# Patient Record
Sex: Female | Born: 1990 | Race: Black or African American | Hispanic: No | Marital: Single | Smoking: Former smoker
Health system: Southern US, Community
[De-identification: ages and names within clinical notes are randomized; demographics above are authoritative.]

## PROBLEM LIST (undated history)

## (undated) DIAGNOSIS — M5030 Other cervical disc degeneration, unspecified cervical region: Secondary | ICD-10-CM

## (undated) DIAGNOSIS — L309 Dermatitis, unspecified: Secondary | ICD-10-CM

## (undated) DIAGNOSIS — K9041 Non-celiac gluten sensitivity: Secondary | ICD-10-CM

## (undated) DIAGNOSIS — B9689 Other specified bacterial agents as the cause of diseases classified elsewhere: Secondary | ICD-10-CM

## (undated) DIAGNOSIS — Q859 Phakomatosis, unspecified: Secondary | ICD-10-CM

## (undated) DIAGNOSIS — K219 Gastro-esophageal reflux disease without esophagitis: Secondary | ICD-10-CM

## (undated) DIAGNOSIS — F909 Attention-deficit hyperactivity disorder, unspecified type: Secondary | ICD-10-CM

## (undated) DIAGNOSIS — N760 Acute vaginitis: Secondary | ICD-10-CM

## (undated) DIAGNOSIS — T7840XA Allergy, unspecified, initial encounter: Secondary | ICD-10-CM

## (undated) DIAGNOSIS — R946 Abnormal results of thyroid function studies: Secondary | ICD-10-CM

## (undated) HISTORY — DX: Allergy, unspecified, initial encounter: T78.40XA

## (undated) HISTORY — DX: Other cervical disc degeneration, unspecified cervical region: M50.30

## (undated) HISTORY — DX: Dermatitis, unspecified: L30.9

## (undated) HISTORY — DX: Attention-deficit hyperactivity disorder, unspecified type: F90.9

## (undated) HISTORY — DX: Gastro-esophageal reflux disease without esophagitis: K21.9

## (undated) HISTORY — DX: Other specified bacterial agents as the cause of diseases classified elsewhere: N76.0

## (undated) HISTORY — DX: Other specified bacterial agents as the cause of diseases classified elsewhere: B96.89

## (undated) HISTORY — DX: Abnormal results of thyroid function studies: R94.6

## (undated) HISTORY — DX: Phakomatosis, unspecified: Q85.9

## (undated) HISTORY — PX: TOOTH EXTRACTION: SUR596

---

## 2014-04-22 ENCOUNTER — Emergency Department (INDEPENDENT_AMBULATORY_CARE_PROVIDER_SITE_OTHER)
Admission: EM | Admit: 2014-04-22 | Discharge: 2014-04-22 | Disposition: A | Discharge status: Home / Self Care | Payer: PRIVATE HEALTH INSURANCE | Source: Home / Self Care | Attending: Emergency Medicine | Admitting: Emergency Medicine

## 2014-04-22 ENCOUNTER — Emergency Department (INDEPENDENT_AMBULATORY_CARE_PROVIDER_SITE_OTHER): Payer: PRIVATE HEALTH INSURANCE

## 2014-04-22 ENCOUNTER — Encounter (HOSPITAL_COMMUNITY): Payer: Self-pay | Admitting: Emergency Medicine

## 2014-04-22 DIAGNOSIS — IMO0001 Reserved for inherently not codable concepts without codable children: Secondary | ICD-10-CM

## 2014-04-22 DIAGNOSIS — M791 Myalgia, unspecified site: Secondary | ICD-10-CM

## 2014-04-22 LAB — CBC WITH DIFFERENTIAL/PLATELET
BASOS ABS: 0 10*3/uL (ref 0.0–0.1)
BASOS PCT: 0 % (ref 0–1)
EOS ABS: 0 10*3/uL (ref 0.0–0.7)
Eosinophils Relative: 1 % (ref 0–5)
HCT: 39.4 % (ref 36.0–46.0)
Hemoglobin: 13.3 g/dL (ref 12.0–15.0)
Lymphocytes Relative: 20 % (ref 12–46)
Lymphs Abs: 1.2 10*3/uL (ref 0.7–4.0)
MCH: 32 pg (ref 26.0–34.0)
MCHC: 33.8 g/dL (ref 30.0–36.0)
MCV: 94.9 fL (ref 78.0–100.0)
Monocytes Absolute: 0.2 10*3/uL (ref 0.1–1.0)
Monocytes Relative: 4 % (ref 3–12)
NEUTROS ABS: 4.3 10*3/uL (ref 1.7–7.7)
NEUTROS PCT: 75 % (ref 43–77)
Platelets: 240 10*3/uL (ref 150–400)
RBC: 4.15 MIL/uL (ref 3.87–5.11)
RDW: 12.3 % (ref 11.5–15.5)
WBC: 5.7 10*3/uL (ref 4.0–10.5)

## 2014-04-22 NOTE — ED Notes (Signed)
Pt  Reports  Some  Swelling  Of the  Glands  Of   Her  Neck         For  About  3  Weeks  With  Vague  Symptoms  Of  Neck  Pain /  Headache      Body  Aches           And  Blurred  Vision       She  Is  Sitting upright on  The  Exam table  Speaking in  Complete  sentances    And  Is  In no  Acute  Distress

## 2014-04-22 NOTE — ED Provider Notes (Signed)
CSN: [id number]     Arrival date & time 04/22/14  1535 History   First MD Initiated Contact with Patient 04/22/14 1555     Chief Complaint  Patient presents with  . Neck Pain   (Consider location/radiation/quality/duration/timing/severity/associated sxs/prior Treatment) HPI She is here today for evaluation of neck pain. He states she has had a 2-3 week history of body aches, including neck and back. She has also noticed some lymph nodes in her right neck. She states she will sometimes feel a pressure sensation in the front of her throat. The neck pain is right-sided. She also noted a lymph node in her groin 2 weeks ago, but states this is resolved. She denies any fevers, sore throat, cough, shortness of breath. She does report night sweats over the last few weeks. She's attributed this to a gluten sensitivity. She does state she's had a little bit of ear pain intermittently. No weight loss.  She has appointments with her primary care, OB/GYN, and GI doctor coming up this month.  History reviewed. No pertinent past medical history. History reviewed. No pertinent past surgical history. History reviewed. No pertinent family history. History  Substance Use Topics  . Smoking status: Not on file  . Smokeless tobacco: Not on file  . Alcohol Use: Not on file   OB History   Grav Para Term Preterm Abortions TAB SAB Ect Mult Living                 Review of Systems  Constitutional: Negative for fever, chills, activity change, appetite change and unexpected weight change.  HENT: Positive for ear pain. Negative for congestion, rhinorrhea, sinus pressure, sore throat and trouble swallowing.   Respiratory: Negative for cough and shortness of breath.   Cardiovascular: Negative for chest pain.  Gastrointestinal: Negative.   Musculoskeletal: Positive for back pain, myalgias and neck pain.  Neurological: Positive for headaches. Negative for weakness.  Hematological: Positive for adenopathy.     Allergies  Review of patient's allergies indicates not on file.  Home Medications   Prior to Admission medications   Not on File   BP 122/81  Pulse 78  Temp(Src) 98.4 F (36.9 C) (Oral)  Resp 18  SpO2 96%  LMP 04/11/2014 Physical Exam  Constitutional: She is oriented to person, place, and time. She appears well-developed and well-nourished. No distress.  HENT:  Head: Normocephalic and atraumatic.  Right Ear: External ear normal.  Left Ear: Tympanic membrane and external ear normal.  Mouth/Throat: Mucous membranes are not dry. No oropharyngeal exudate, posterior oropharyngeal edema or posterior oropharyngeal erythema.  Eyes: Conjunctivae are normal. Right eye exhibits no discharge. Left eye exhibits no discharge.  Neck: Normal range of motion. Neck supple.  Right sided cervical LAD.  Fullness of bilateral anterior neck not clearly related to thyroid.  Cardiovascular: Normal rate, regular rhythm and normal heart sounds.   No murmur heard. Pulmonary/Chest: Effort normal and breath sounds normal. No respiratory distress. She has no wheezes. She has no rales.  Musculoskeletal: She exhibits no edema.  Lymphadenopathy:    She has no axillary adenopathy.       Right: No inguinal and no supraclavicular adenopathy present.       Left: No inguinal and no supraclavicular adenopathy present.  Neurological: She is alert and oriented to person, place, and time.  Skin: Skin is warm and dry. She is not diaphoretic.    ED Course  Procedures (including critical care time) Labs Review Labs Reviewed  CBC WITH DIFFERENTIAL  Imaging Review Dg Neck Soft Tissue  04/22/2014   CLINICAL DATA:  Stiff neck with swollen glands.  EXAM: NECK SOFT TISSUES - 2+ VIEW  COMPARISON:  None.  FINDINGS: There is no evidence of retropharyngeal soft tissue swelling or epiglottic enlargement. The cervical airway is unremarkable and no radio-opaque foreign body identified.  IMPRESSION: Negative. If further  investigation is desired, recommend CT of the neck with contrast.   Electronically Signed   By: Davonna Belling M.D.   On: 04/22/2014 16:52     MDM   1. Myalgia    X-ray reviewed are normal. CBC reviewed also normal. Concern was for a lymphoma with report of lymphadenopathy and night sweats. However, CBC does not support this. No signs of bacterial or viral infection on exam. Recommended alternating Tylenol and Motrin every 4 hours for the next few days. Followup with her regular physician as scheduled in 2 weeks.    Charm Rings, MD 04/22/14 [phone]

## 2014-04-22 NOTE — Discharge Instructions (Signed)
Your CBC and neck x-ray are normal. You do not have any signs of a viral or bacterial infection.  Alternate tylenol and ibuprofen every 4 hours for the next 2-3 days. Follow up with your regular doctor as scheduled.

## 2014-09-05 ENCOUNTER — Emergency Department (HOSPITAL_COMMUNITY): Payer: PRIVATE HEALTH INSURANCE

## 2014-09-05 ENCOUNTER — Emergency Department (HOSPITAL_COMMUNITY)
Admission: EM | Admit: 2014-09-05 | Discharge: 2014-09-05 | Disposition: A | Discharge status: Home / Self Care | Payer: PRIVATE HEALTH INSURANCE | Attending: Emergency Medicine | Admitting: Emergency Medicine

## 2014-09-05 ENCOUNTER — Encounter (HOSPITAL_COMMUNITY): Payer: Self-pay | Admitting: Emergency Medicine

## 2014-09-05 DIAGNOSIS — R0789 Other chest pain: Secondary | ICD-10-CM | POA: Diagnosis not present

## 2014-09-05 DIAGNOSIS — Z3202 Encounter for pregnancy test, result negative: Secondary | ICD-10-CM | POA: Insufficient documentation

## 2014-09-05 DIAGNOSIS — Z791 Long term (current) use of non-steroidal anti-inflammatories (NSAID): Secondary | ICD-10-CM | POA: Insufficient documentation

## 2014-09-05 DIAGNOSIS — Z720 Tobacco use: Secondary | ICD-10-CM | POA: Insufficient documentation

## 2014-09-05 DIAGNOSIS — Z79899 Other long term (current) drug therapy: Secondary | ICD-10-CM | POA: Insufficient documentation

## 2014-09-05 DIAGNOSIS — Z8719 Personal history of other diseases of the digestive system: Secondary | ICD-10-CM | POA: Diagnosis not present

## 2014-09-05 DIAGNOSIS — R079 Chest pain, unspecified: Secondary | ICD-10-CM | POA: Diagnosis present

## 2014-09-05 HISTORY — DX: Non-celiac gluten sensitivity: K90.41

## 2014-09-05 LAB — URINALYSIS, ROUTINE W REFLEX MICROSCOPIC
Bilirubin Urine: NEGATIVE
Glucose, UA: NEGATIVE mg/dL
Ketones, ur: NEGATIVE mg/dL
Leukocytes, UA: NEGATIVE
Nitrite: NEGATIVE
Protein, ur: NEGATIVE mg/dL
Specific Gravity, Urine: 1.014 (ref 1.005–1.030)
Urobilinogen, UA: 0.2 mg/dL (ref 0.0–1.0)
pH: 8 (ref 5.0–8.0)

## 2014-09-05 LAB — I-STAT CHEM 8, ED
BUN: 7 mg/dL (ref 6–23)
Calcium, Ion: 1.31 mmol/L — ABNORMAL HIGH (ref 1.12–1.23)
Chloride: 105 mEq/L (ref 96–112)
Creatinine, Ser: 0.8 mg/dL (ref 0.50–1.10)
Glucose, Bld: 125 mg/dL — ABNORMAL HIGH (ref 70–99)
HCT: 45 % (ref 36.0–46.0)
Hemoglobin: 15.3 g/dL — ABNORMAL HIGH (ref 12.0–15.0)
Potassium: 4 mEq/L (ref 3.7–5.3)
Sodium: 139 mEq/L (ref 137–147)
TCO2: 22 mmol/L (ref 0–100)

## 2014-09-05 LAB — PREGNANCY, URINE: Preg Test, Ur: NEGATIVE

## 2014-09-05 LAB — URINE MICROSCOPIC-ADD ON

## 2014-09-05 MED ORDER — IBUPROFEN 800 MG PO TABS: 800.0000 mg | ORAL_TABLET | Freq: Three times a day (TID) | ORAL | Status: DC | PRN

## 2014-09-05 NOTE — ED Provider Notes (Signed)
CSN: [id number]     Arrival date & time 09/05/14  0708 History   First MD Initiated Contact with Patient 09/05/14 [id number]     Chief Complaint  Patient presents with  . Chest Pain     (Consider location/radiation/quality/duration/timing/severity/associated sxs/prior Treatment) HPI Patient presents to the emergency department with left-sided chest pain that started last night.  The patient states that she felt sharp pain in the left side of her chest in her breast.  Patient states that it hurt worse when she laid on her left side and when she moved in certain ways.  Patient, states she has had this same type of pain on the right side of her chest in the past.  The patient states that nothing seems to make her condition better.  Patient denies shortness breath, diaphoresis, nausea, vomiting, weakness, dizziness, headache, blurred vision, back pain, neck pain, abdominal pain, diarrhea, dysuria, fever, cough, runny nose, sore throat, rash, near syncope or syncope.  The patient states that she took naproxen before coming to the hospital.  It seemed to help with her discomfort Past Medical History  Diagnosis Date  . Gluten intolerance    History reviewed. No pertinent past surgical history. No family history on file. History  Substance Use Topics  . Smoking status: Current Every Day Smoker -- 0.10 packs/day    Types: Cigarettes  . Smokeless tobacco: Not on file  . Alcohol Use: Yes   OB History    No data available     Review of Systems  All other systems negative except as documented in the HPI. All pertinent positives and negatives as reviewed in the HPI.  Allergies  Gluten meal and Hydrocodone  Home Medications   Prior to Admission medications   Medication Sig Start Date End Date Taking? Authorizing Provider  Levonorgestrel-Ethinyl Estradiol (CAMRESE) 0.15-0.03 &0.01 MG tablet Take 1 tablet by mouth daily.   Yes Historical Provider, MD  Multiple Vitamin (MULTIVITAMIN) tablet Take 1  tablet by mouth daily.   Yes Historical Provider, MD  naproxen sodium (ANAPROX) 220 MG tablet Take 220 mg by mouth every other day.   Yes Historical Provider, MD  predniSONE (DELTASONE) 10 MG tablet Take 10 mg by mouth daily with breakfast.   Yes Historical Provider, MD  predniSONE (STERAPRED UNI-PAK) 5 MG TABS tablet Take by mouth.    Historical Provider, MD   BP 122/69 mmHg  Pulse 102  Temp(Src) 97.9 F (36.6 C) (Oral)  Resp 24  Ht 5\' 4"  (1.626 m)  Wt 138 lb (62.596 kg)  BMI 23.68 kg/m2  SpO2 100%  LMP 09/04/2014 Physical Exam  Constitutional: She is oriented to person, place, and time. She appears well-developed and well-nourished. No distress.  HENT:  Head: Normocephalic and atraumatic.  Mouth/Throat: Oropharynx is clear and moist.  Eyes: Pupils are equal, round, and reactive to light.  Neck: Normal range of motion. Neck supple.  Cardiovascular: Normal rate, regular rhythm and normal heart sounds.  Exam reveals no gallop and no friction rub.   No murmur heard. Pulmonary/Chest: Effort normal and breath sounds normal. No respiratory distress. She has no wheezes. She has no rales. She exhibits tenderness.  Abdominal: Soft. Bowel sounds are normal. She exhibits no distension. There is no tenderness.  Musculoskeletal: She exhibits no edema.  Neurological: She is alert and oriented to person, place, and time. She exhibits normal muscle tone. Coordination normal.  Skin: Skin is warm and dry. No rash noted. No erythema.  Psychiatric: She has a normal  mood and affect. Her behavior is normal.  Nursing note and vitals reviewed.   ED Course  Procedures (including critical care time) Labs Review Labs Reviewed  URINALYSIS, ROUTINE W REFLEX MICROSCOPIC - Abnormal; Notable for the following:    APPearance TURBID (*)    Hgb urine dipstick TRACE (*)    All other components within normal limits  I-STAT CHEM 8, ED - Abnormal; Notable for the following:    Glucose, Bld 125 (*)    Calcium,  Ion 1.31 (*)    Hemoglobin 15.3 (*)    All other components within normal limits  PREGNANCY, URINE  URINE MICROSCOPIC-ADD ON    Imaging Review Dg Chest 2 View  09/05/2014   CLINICAL DATA:  Chest pain left side pain starting today  EXAM: CHEST  2 VIEW  COMPARISON:  None.  FINDINGS: Cardiomediastinal silhouette is unremarkable. No acute infiltrate or pleural effusion. No pulmonary edema. Bony thorax is unremarkable.  IMPRESSION: No active cardiopulmonary disease.   Electronically Signed   By: Natasha MeadLiviu  Pop M.D.   On: 09/05/2014 08:12     EKG Interpretation   Date/Time:  Friday September 05 2014 07:14:47 EST Ventricular Rate:  95 PR Interval:  108 QRS Duration: 86 QT Interval:  330 QTC Calculation: 414 R Axis:   85 Text Interpretation:  Sinus rhythm with short PR Otherwise normal ECG  Confirmed by Rubin PayorPICKERING  MD, Harrold DonathNATHAN [phone]([id number]) on 09/05/2014 9:21:33 AM     The patient has no risk factors, cardiac disease.  The patient is also PERC negative and low risk based on well's criteria.  The pain is worse with movement and palpation and certain positions seems less likely to be related to heart-related issue.  The pain is reproducible on exam and based on her age and no risk factors.  We will have her follow-up with her primary care Dr. told to return here as needed.  Also advised her to use heat on the chest wall MDM   Final diagnoses:  None        Carlyle DollyChristopher W Darcy Barbara, PA-C 09/05/14 [phone]  Juliet RudeNathan R. Rubin PayorPickering, MD 09/07/14 [phone]

## 2014-09-05 NOTE — ED Notes (Signed)
Patient states started having L sided chest pain last night at approximately 2300 with "coldness on my L side".   Patient states she feels cold on her L breast.   Patient states the chest pain is intermittent and she feels mostly when she lays on her L side or when she lays flat on her chest.   Patient states she has a gluten allergy and has been going to the bathroom a lot since yesterday, but states it was something she ate.  Patient denies other symptoms.

## 2014-09-05 NOTE — Discharge Instructions (Signed)
Return here as needed.  Follow-up with your primary care Dr. for a recheck your testing here today was normal

## 2014-10-21 ENCOUNTER — Encounter (HOSPITAL_COMMUNITY): Payer: Self-pay | Admitting: *Deleted

## 2014-10-21 ENCOUNTER — Emergency Department (HOSPITAL_COMMUNITY)
Admission: EM | Admit: 2014-10-21 | Discharge: 2014-10-21 | Disposition: A | Discharge status: Home / Self Care | Payer: PRIVATE HEALTH INSURANCE | Source: Home / Self Care | Attending: Family Medicine | Admitting: Family Medicine

## 2014-10-21 DIAGNOSIS — M542 Cervicalgia: Secondary | ICD-10-CM

## 2014-10-21 DIAGNOSIS — G8929 Other chronic pain: Secondary | ICD-10-CM

## 2014-10-21 NOTE — ED Notes (Signed)
Pt  Has  Some  Discomfort  And  Swelling      Of   Neck        Since  Aug   2015         Was  Seen  ucc  At that  Time      Had  Neck  X  Ray   And  Lab  Work        Seen  Again  6  Weeks  Ago er  For  Chest pain  Had  cxr     Has  An appt  With    A  pcp  In  sev  Weeks   For  Physical   -  Pt  Spoke  With a  Tele doc this  Am  Who  Recommend  She  Be  Seen today  For  Neck  Swelling     Muscle  Pain r  Upper  Chest    -  She  denys a  sorethroat        She  denys  A  Recent injury       -  She is  Sitting  Upright on  The  Exam table  Speaking in  Complete  sentances             In  No  Acute  Distress

## 2014-10-21 NOTE — Discharge Instructions (Signed)
See your doctor as planned for further eval of neck pain issues.

## 2014-10-21 NOTE — ED Provider Notes (Signed)
CSN: [id number]     Arrival date & time 10/21/14  1500 History   First MD Initiated Contact with Patient 10/21/14 1518     Chief Complaint  Patient presents with  . Neck Pain   (Consider location/radiation/quality/duration/timing/severity/associated sxs/prior Treatment) Patient is a 24 y.o. female presenting with neck pain. The history is provided by the patient.  Neck Pain Pain location:  Generalized neck Quality:  Shooting Pain radiates to:  Does not radiate Pain severity:  Mild Onset quality:  Gradual Duration:  6 months Timing:  Intermittent Progression:  Worsening Chronicity:  Chronic Context: not recent injury   Context comment:  [facility] also in ER for mult sx, no lmd f/u until next week, having mult pain issues., fatigue etc. Associated symptoms: chest pain     Past Medical History  Diagnosis Date  . Gluten intolerance    History reviewed. No pertinent past surgical history. History reviewed. No pertinent family history. History  Substance Use Topics  . Smoking status: Current Every Day Smoker -- 0.10 packs/day    Types: Cigarettes  . Smokeless tobacco: Not on file  . Alcohol Use: Yes   OB History    No data available     Review of Systems  Constitutional: Positive for fatigue.  Cardiovascular: Positive for chest pain. Negative for leg swelling.  Gastrointestinal: Negative.   Musculoskeletal: Positive for neck pain.    Allergies  Gluten meal; Hydrocodone; and Prednisone  Home Medications   Prior to Admission medications   Medication Sig Start Date End Date Taking? Authorizing Provider  ibuprofen (ADVIL,MOTRIN) 800 MG tablet Take 1 tablet (800 mg total) by mouth every 8 (eight) hours as needed. 09/05/14   Jamesetta Orleanshristopher W Lawyer, PA-C  Levonorgestrel-Ethinyl Estradiol (CAMRESE) 0.15-0.03 &0.01 MG tablet Take 1 tablet by mouth daily.    Historical Provider, MD  Multiple Vitamin (MULTIVITAMIN) tablet Take 1 tablet by mouth daily.    Historical  Provider, MD  naproxen sodium (ANAPROX) 220 MG tablet Take 220 mg by mouth every other day.    Historical Provider, MD  predniSONE (DELTASONE) 10 MG tablet Take 10 mg by mouth daily with breakfast.    Historical Provider, MD  predniSONE (STERAPRED UNI-PAK) 5 MG TABS tablet Take by mouth.    Historical Provider, MD   BP 114/72 mmHg  Pulse 78  Temp(Src) 98.6 F (37 C) (Oral)  Resp 18  SpO2 100%  LMP 09/28/2014 Physical Exam  Constitutional: She is oriented to person, place, and time. She appears well-developed and well-nourished. No distress.  HENT:  Head: Normocephalic.  Right Ear: External ear normal.  Left Ear: External ear normal.  Mouth/Throat: Oropharynx is clear and moist.  Eyes: Conjunctivae are normal. Pupils are equal, round, and reactive to light.  Neck: Normal range of motion. Neck supple. No tracheal deviation present. Thyromegaly present.  Cardiovascular: Normal rate, regular rhythm, normal heart sounds and intact distal pulses.   Pulmonary/Chest: Effort normal and breath sounds normal.  Lymphadenopathy:    She has no cervical adenopathy.  Neurological: She is alert and oriented to person, place, and time.  Skin: Skin is warm and dry.  Nursing note and vitals reviewed.   ED Course  Procedures (including critical care time) Labs Review Labs Reviewed - No data to display  Imaging Review No results found.   MDM   1. Neck pain of over 3 months duration        Linna HoffJames D Ralphine Hinks, MD 10/21/14 1546

## 2014-10-28 ENCOUNTER — Emergency Department (HOSPITAL_COMMUNITY): Payer: PRIVATE HEALTH INSURANCE

## 2014-10-28 ENCOUNTER — Emergency Department (HOSPITAL_COMMUNITY)
Admission: EM | Admit: 2014-10-28 | Discharge: 2014-10-28 | Disposition: A | Discharge status: Home / Self Care | Payer: PRIVATE HEALTH INSURANCE | Attending: Emergency Medicine | Admitting: Emergency Medicine

## 2014-10-28 DIAGNOSIS — R0789 Other chest pain: Secondary | ICD-10-CM | POA: Insufficient documentation

## 2014-10-28 DIAGNOSIS — Z793 Long term (current) use of hormonal contraceptives: Secondary | ICD-10-CM | POA: Diagnosis not present

## 2014-10-28 DIAGNOSIS — Z8719 Personal history of other diseases of the digestive system: Secondary | ICD-10-CM | POA: Diagnosis not present

## 2014-10-28 DIAGNOSIS — Z720 Tobacco use: Secondary | ICD-10-CM | POA: Insufficient documentation

## 2014-10-28 DIAGNOSIS — R079 Chest pain, unspecified: Secondary | ICD-10-CM | POA: Diagnosis present

## 2014-10-28 LAB — CBC
HEMATOCRIT: 39.9 % (ref 36.0–46.0)
Hemoglobin: 13.7 g/dL (ref 12.0–15.0)
MCH: 31.9 pg (ref 26.0–34.0)
MCHC: 34.3 g/dL (ref 30.0–36.0)
MCV: 92.8 fL (ref 78.0–100.0)
Platelets: 243 10*3/uL (ref 150–400)
RBC: 4.3 MIL/uL (ref 3.87–5.11)
RDW: 12 % (ref 11.5–15.5)
WBC: 5.5 10*3/uL (ref 4.0–10.5)

## 2014-10-28 LAB — BASIC METABOLIC PANEL
Anion gap: 8 (ref 5–15)
BUN: 10 mg/dL (ref 6–23)
CALCIUM: 9.1 mg/dL (ref 8.4–10.5)
CHLORIDE: 106 mmol/L (ref 96–112)
CO2: 22 mmol/L (ref 19–32)
Creatinine, Ser: 0.82 mg/dL (ref 0.50–1.10)
GFR calc Af Amer: >90 mL/min (ref >90)
GFR calc non Af Amer: >90 mL/min (ref >90)
Glucose, Bld: 104 mg/dL — ABNORMAL HIGH (ref 70–99)
Potassium: 3.6 mmol/L (ref 3.5–5.1)
SODIUM: 136 mmol/L (ref 135–145)

## 2014-10-28 LAB — I-STAT TROPONIN, ED: TROPONIN I, POC: 0 ng/mL (ref 0.00–0.08)

## 2014-10-28 LAB — D-DIMER, QUANTITATIVE (NOT AT ARMC): D-Dimer, Quant: <0.27 ug/mL-FEU (ref 0.00–0.48)

## 2014-10-28 MED ORDER — LORAZEPAM 0.5 MG PO TABS: 0.5000 mg | ORAL_TABLET | Freq: Three times a day (TID) | ORAL | Status: DC | PRN

## 2014-10-28 MED ORDER — NAPROXEN 500 MG PO TABS: 500.0000 mg | ORAL_TABLET | Freq: Two times a day (BID) | ORAL | Status: DC

## 2014-10-28 NOTE — ED Notes (Signed)
Upper lt. Chest wall pain and rt. Lower, lateral chest pain. Pain hurts more with exertion. Pt. Not taking pain relievers. Called dr. And told to come her b/c nodule on Friday.

## 2014-10-28 NOTE — Discharge Instructions (Signed)
Return here as needed. Follow up with your  Primary care doctor.

## 2014-10-28 NOTE — ED Notes (Signed)
Ordered hosp bed 

## 2014-10-31 NOTE — ED Provider Notes (Signed)
CSN: [id number]     Arrival date & time 10/28/14  1659 History   First MD Initiated Contact with Patient 10/28/14 2014     Chief Complaint  Patient presents with  . Chest Pain     (Consider location/radiation/quality/duration/timing/severity/associated sxs/prior Treatment) HPI Patient presents to the emergency department with diffuse bilateral chest pain that has been constant for the last 3 days.  Patient states she was seen by her primary care doctor for this and they are testing did not show any abnormality.  The patient denies shortness breath, weakness, dizziness, headache, blurred vision, back pain, neck pain, abdominal pain, nausea, vomiting, diarrhea, cough, runny nose, sore throat, near syncope, lightheadedness, or syncope.  Patient states nothing seems make her condition better or worse. Past Medical History  Diagnosis Date  . Gluten intolerance    No past surgical history on file. No family history on file. History  Substance Use Topics  . Smoking status: Current Every Day Smoker -- 0.10 packs/day    Types: Cigarettes  . Smokeless tobacco: Not on file  . Alcohol Use: Yes   OB History    No data available     Review of Systems All other systems negative except as documented in the HPI. All pertinent positives and negatives as reviewed in the HPI.   Allergies  Gluten meal; Hydrocodone; and Prednisone  Home Medications   Prior to Admission medications   Medication Sig Start Date End Date Taking? Authorizing Provider  Levonorgestrel-Ethinyl Estradiol (CAMRESE) 0.15-0.03 &0.01 MG tablet Take 1 tablet by mouth daily.   Yes Historical Provider, MD  naproxen sodium (ANAPROX) 220 MG tablet Take 220 mg by mouth every other day.   Yes Historical Provider, MD  ibuprofen (ADVIL,MOTRIN) 800 MG tablet Take 1 tablet (800 mg total) by mouth every 8 (eight) hours as needed. Patient not taking: Reported on 10/28/2014 09/05/14   Jamesetta Orleans Aide Wojnar, PA-C  LORazepam (ATIVAN) 0.5 MG  tablet Take 1 tablet (0.5 mg total) by mouth 3 (three) times daily as needed for anxiety. 10/28/14   Jamesetta Orleans Enez Monahan, PA-C  naproxen (NAPROSYN) 500 MG tablet Take 1 tablet (500 mg total) by mouth 2 (two) times daily. 10/28/14   Jamesetta Orleans Hildagarde Holleran, PA-C   BP 139/68 mmHg  Pulse 84  Temp(Src) 98.6 F (37 C) (Oral)  Resp 14  SpO2 100%  LMP 09/28/2014 Physical Exam  Constitutional: She is oriented to person, place, and time. She appears well-developed and well-nourished.  HENT:  Head: Normocephalic and atraumatic.  Mouth/Throat: Oropharynx is clear and moist.  Eyes: Pupils are equal, round, and reactive to light.  Neck: Normal range of motion. Neck supple.  Cardiovascular: Normal rate, regular rhythm and normal heart sounds.  Exam reveals no gallop and no friction rub.   No murmur heard. Pulmonary/Chest: Effort normal and breath sounds normal. She has no wheezes. She has no rales. She exhibits tenderness.  Musculoskeletal: She exhibits no edema.  Neurological: She is alert and oriented to person, place, and time. She exhibits normal muscle tone. Coordination normal.  Skin: Skin is warm and dry. No rash noted. No erythema.  Psychiatric: She has a normal mood and affect. Her behavior is normal.  Nursing note and vitals reviewed.   ED Course  Procedures (including critical care time) Labs Review Labs Reviewed  BASIC METABOLIC PANEL - Abnormal; Notable for the following:    Glucose, Bld 104 (*)    All other components within normal limits  CBC  D-DIMER, QUANTITATIVE  I-STAT  TROPOININ, ED    Imaging Review No results found.   EKG Interpretation   Date/Time:  Tuesday October 28 2014 17:05:49 EST Ventricular Rate:  101 PR Interval:  112 QRS Duration: 84 QT Interval:  322 QTC Calculation: 417 R Axis:   83 Text Interpretation:  Sinus tachycardia Otherwise normal ECG ED PHYSICIAN  INTERPRETATION AVAILABLE IN [facility] Confirmed by TEST, Record  ([id number]) on 10/30/2014  6:57:43 AM     Patient most likely has chest wall pain based on her age.  Physical exam findings, the pain has been constant over the last 4 days.  Patient is PERC negative and low risk based on well's criteria.  She has a negative d-dimer.  1 Troponin was done due to the length of time.  She has had pain that is constant nature and it was negative. MDM   Final diagnoses:  Chest discomfort        Carlyle DollyChristopher W Adoni Greenough, PA-C 11/01/14 0018  Flint MelterElliott L Wentz, MD 11/04/14 [phone]

## 2015-12-25 ENCOUNTER — Other Ambulatory Visit: Payer: Self-pay | Admitting: Internal Medicine

## 2015-12-25 DIAGNOSIS — E049 Nontoxic goiter, unspecified: Secondary | ICD-10-CM

## 2016-01-06 ENCOUNTER — Ambulatory Visit
Admission: RE | Admit: 2016-01-06 | Discharge: 2016-01-06 | Disposition: A | Discharge status: Home / Self Care | Payer: PRIVATE HEALTH INSURANCE | Source: Ambulatory Visit | Attending: Internal Medicine | Admitting: Internal Medicine

## 2016-01-06 DIAGNOSIS — E049 Nontoxic goiter, unspecified: Secondary | ICD-10-CM

## 2017-03-17 IMAGING — US US THYROID
1 series · 14 of 25 positions shown · non-contrast
Comparison: None.

CLINICAL DATA: Goiter

EXAM:
THYROID ULTRASOUND
TECHNIQUE: Ultrasound examination of the thyroid gland and adjacent soft
tissues was performed.

[Series 1: us thyroid · 0.06mm/px · 14 of 32 slices shown]
[im 1/32]
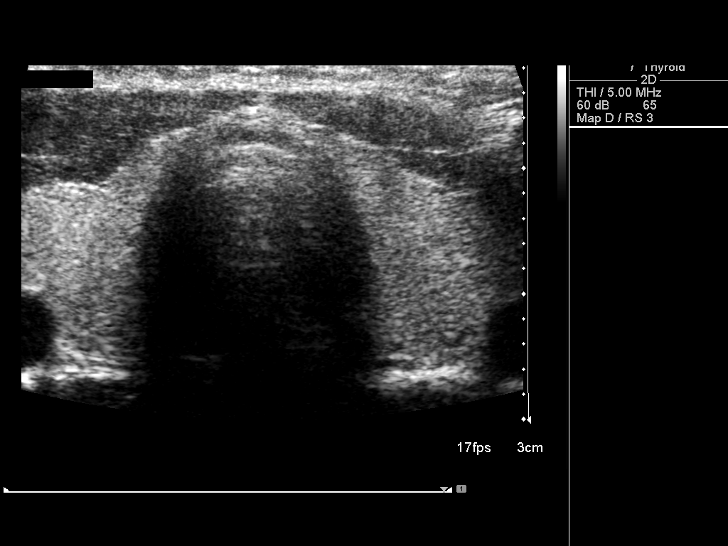
[im 3/32]
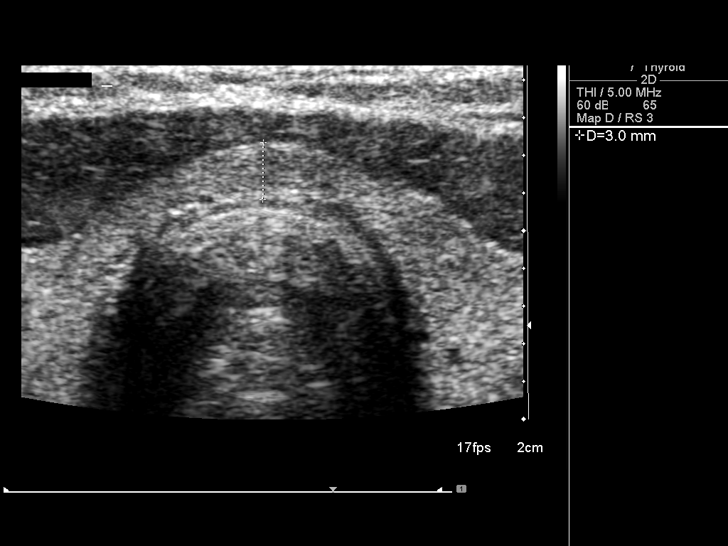
[im 6/32]
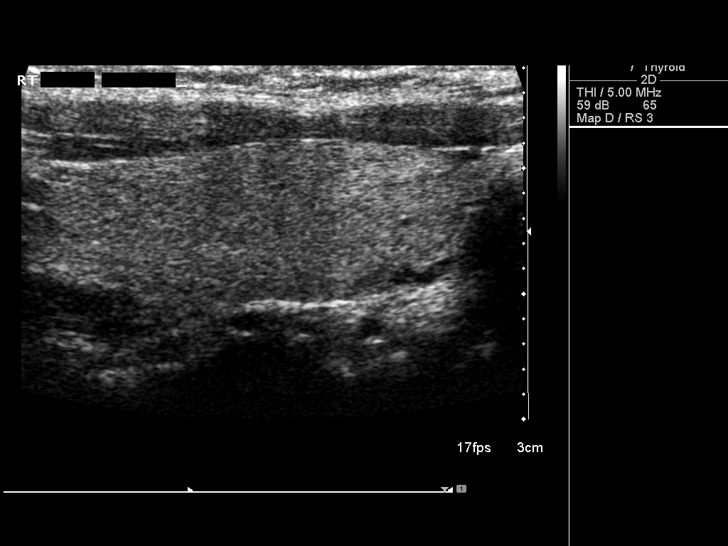
[im 8/32]
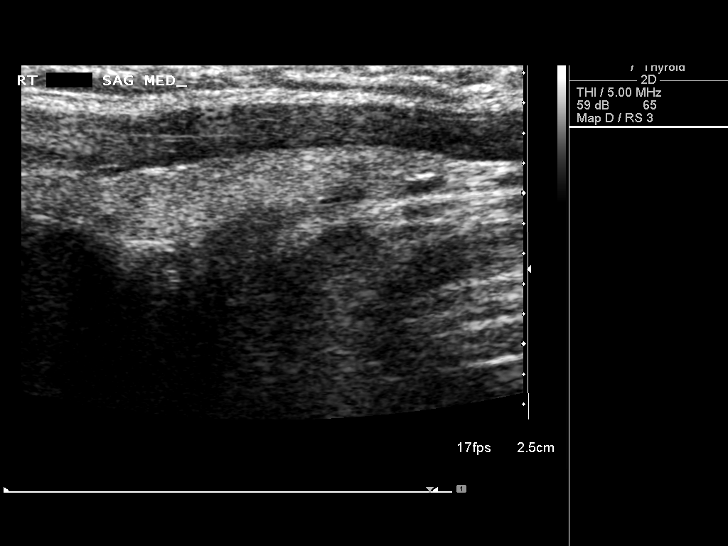
[im 11/32]
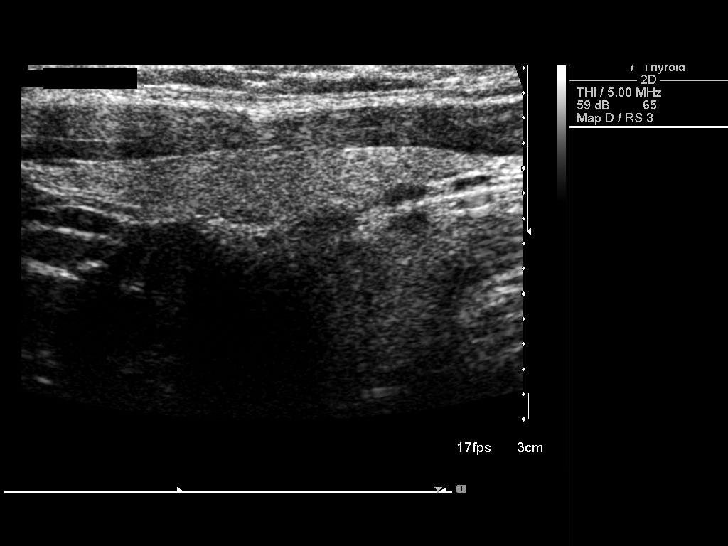
[im 12/32]
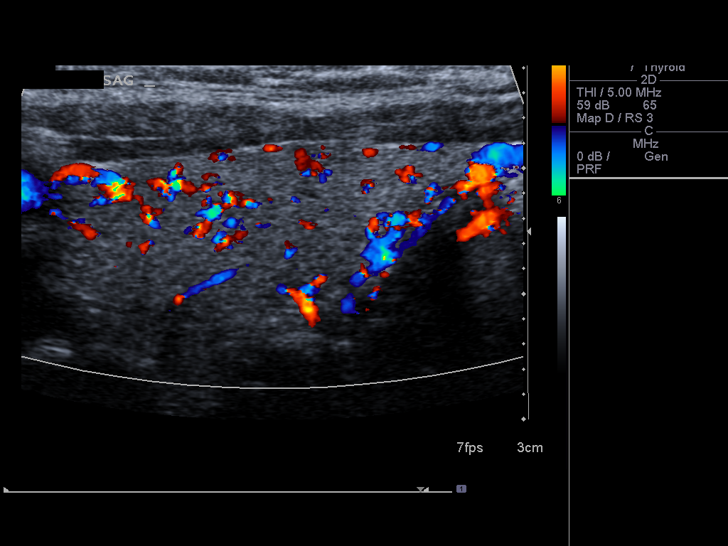
[im 15/32]
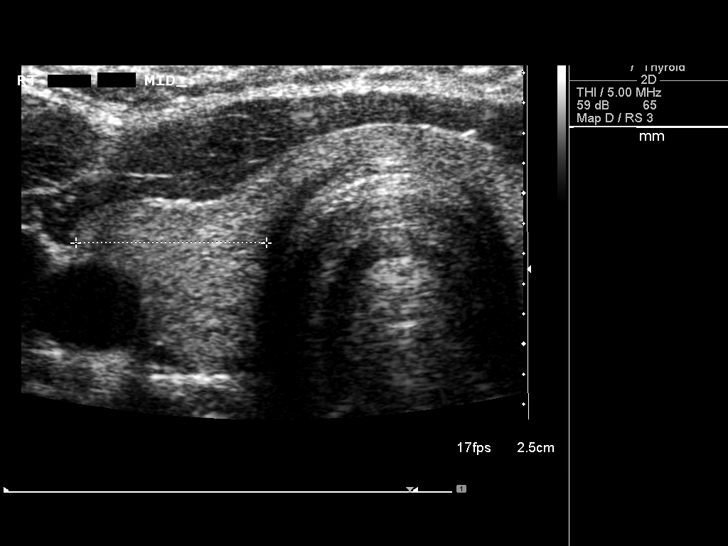
[im 17/32]
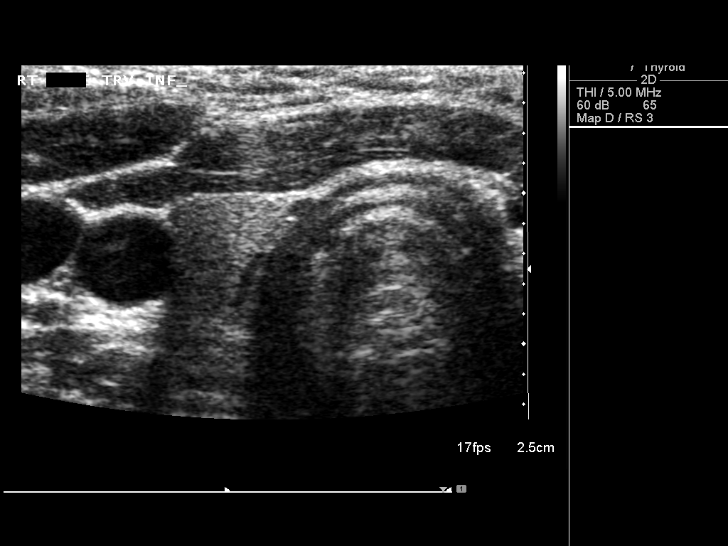
[im 20/32]
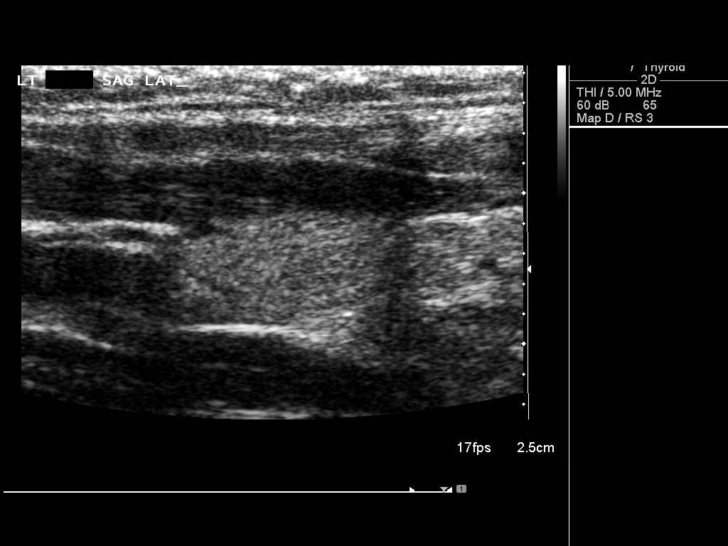
[im 21/32]
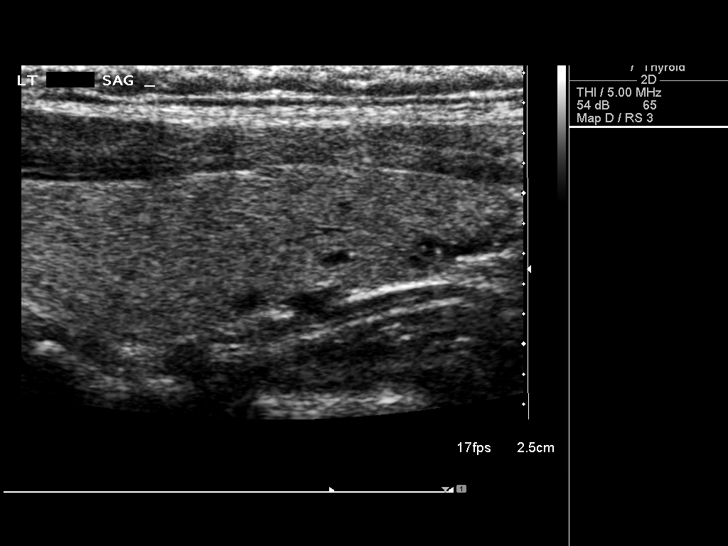
[im 24/32]
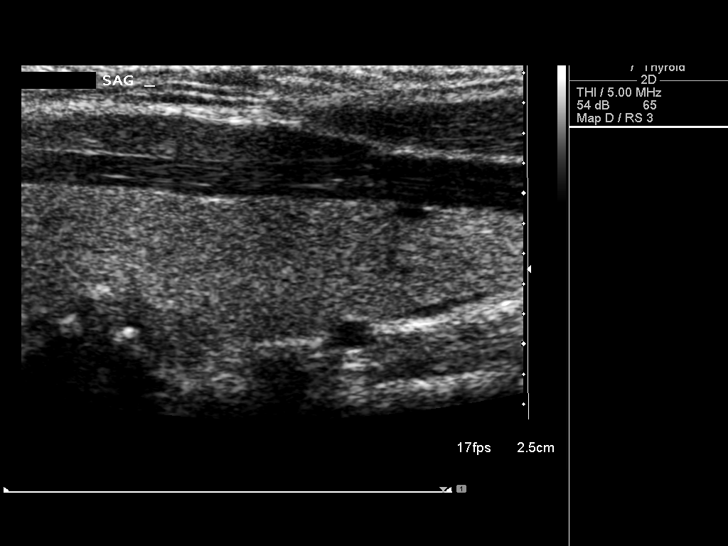
[im 26/32]
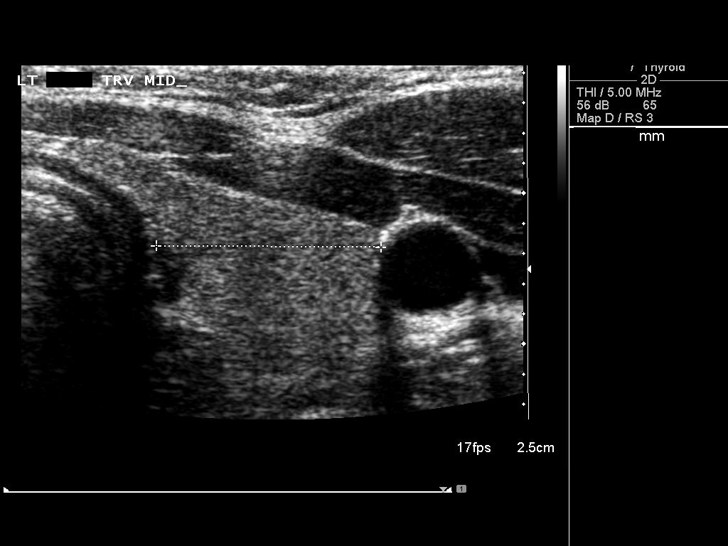
[im 29/32]
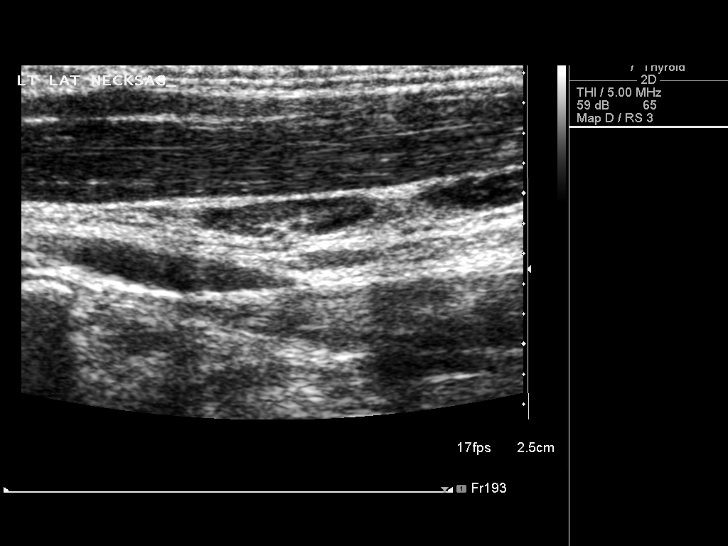
[im 32/32]
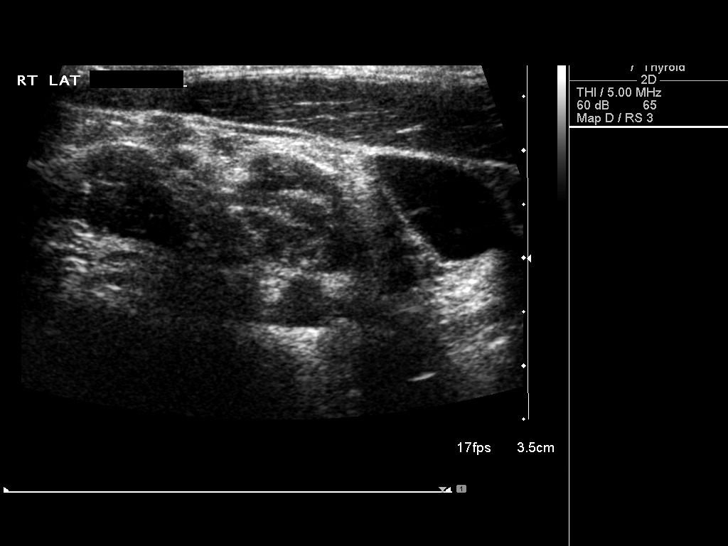

[14 of 25 positions shown; findings below may reference images not displayed]

FINDINGS: Right thyroid lobe

Measurements: 39 x 13 x 13 mm.  No nodules visualized.

Left thyroid lobe

Measurements: 38 x 12 x 15 mm.  No nodules visualized.

Isthmus

Thickness: 3 mm.  No nodules visualized.

Lymphadenopathy

None visualized.
IMPRESSION: Normal thyroid

## 2018-08-15 ENCOUNTER — Other Ambulatory Visit: Payer: Self-pay | Admitting: Internal Medicine

## 2018-08-15 DIAGNOSIS — E049 Nontoxic goiter, unspecified: Secondary | ICD-10-CM

## 2019-05-08 ENCOUNTER — Other Ambulatory Visit: Payer: Self-pay

## 2019-05-09 ENCOUNTER — Ambulatory Visit: Payer: PRIVATE HEALTH INSURANCE | Admitting: Internal Medicine

## 2019-05-10 ENCOUNTER — Ambulatory Visit: Payer: PRIVATE HEALTH INSURANCE | Admitting: Internal Medicine

## 2019-05-10 DIAGNOSIS — Z0289 Encounter for other administrative examinations: Secondary | ICD-10-CM

## 2019-07-23 ENCOUNTER — Other Ambulatory Visit: Payer: Self-pay

## 2019-07-23 ENCOUNTER — Encounter: Payer: Self-pay | Admitting: Internal Medicine

## 2019-07-23 ENCOUNTER — Ambulatory Visit (INDEPENDENT_AMBULATORY_CARE_PROVIDER_SITE_OTHER): Payer: PRIVATE HEALTH INSURANCE | Admitting: Internal Medicine

## 2019-07-23 VITALS — Ht 65.0 in | Wt 125.3 lb

## 2019-07-23 DIAGNOSIS — R221 Localized swelling, mass and lump, neck: Secondary | ICD-10-CM

## 2019-07-23 DIAGNOSIS — R946 Abnormal results of thyroid function studies: Secondary | ICD-10-CM | POA: Diagnosis not present

## 2019-07-23 DIAGNOSIS — Z91018 Allergy to other foods: Secondary | ICD-10-CM

## 2019-07-23 DIAGNOSIS — Z1389 Encounter for screening for other disorder: Secondary | ICD-10-CM

## 2019-07-23 DIAGNOSIS — Z124 Encounter for screening for malignant neoplasm of cervix: Secondary | ICD-10-CM

## 2019-07-23 DIAGNOSIS — M2550 Pain in unspecified joint: Secondary | ICD-10-CM

## 2019-07-23 DIAGNOSIS — H04123 Dry eye syndrome of bilateral lacrimal glands: Secondary | ICD-10-CM | POA: Diagnosis not present

## 2019-07-23 DIAGNOSIS — L309 Dermatitis, unspecified: Secondary | ICD-10-CM | POA: Insufficient documentation

## 2019-07-23 DIAGNOSIS — R682 Dry mouth, unspecified: Secondary | ICD-10-CM | POA: Diagnosis not present

## 2019-07-23 DIAGNOSIS — J309 Allergic rhinitis, unspecified: Secondary | ICD-10-CM

## 2019-07-23 DIAGNOSIS — E559 Vitamin D deficiency, unspecified: Secondary | ICD-10-CM

## 2019-07-23 DIAGNOSIS — Z13220 Encounter for screening for lipoid disorders: Secondary | ICD-10-CM

## 2019-07-23 DIAGNOSIS — Z0000 Encounter for general adult medical examination without abnormal findings: Secondary | ICD-10-CM
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
## 2019-07-23 NOTE — Patient Instructions (Addendum)
Attach a photo of your skin to my chart  though it possibly is eczema with your history of eczema see tips below   Your Tdap vaccine is due call the clinic to schedule a nurse visit to get this or you can have done at your next follow up in person or at your pharmacy this should be every 10 years for everyone   Please have fasting labs done at [facility]   We have ordered ob/gyn referred to Dr. Cherly [place]ousins in [place]  We have [place] allergy [place] [facility] [place] [facility]    Use cetaphil or cerave creams to your skin with dove unscented bar soap or white soap. Moisturize immediately after drying off from the shower but moisturize to damp skin   Use all free/clear laundry detergents or brands without scents   Eye drops Similasan dry eye relief  Or complete eye relief or visine discuss with your eye doctor about specific eye drops for dry eyes systane/Restasis, etc.    Eczema Eczema is a broad term for a group of skin conditions that cause skin to become rough and inflamed. Each type of eczema has different triggers, symptoms, and treatments. Eczema of any type is usually itchy and symptoms range from mild to severe. Eczema and its symptoms are not spread from person to person (are not contagious). It can appear on different parts of the body at different times. Your eczema may not look the same as someone else's eczema. What are the types of eczema? Atopic dermatitis This is a long-term (chronic) skin disease that keeps coming back (recurring). Usual symptoms are dry skin and small, solid pimples that may swell and leak fluid (weep). Contact dermatitis  This happens when something irritates the skin and causes a rash. The irritation can come from substances that you are allergic to (allergens), such as poison ivy, chemicals, or medicines that were applied to your skin. Dyshidrotic eczema This is a form of eczema on the hands and feet. It shows up as very itchy, fluid-filled blisters.  It can affect people of any age, but is more common before age 29. Hand eczema  This causes very itchy areas of skin on the palms and sides of the hands and fingers. This type of eczema is common in industrial jobs where you may be exposed to many different types of irritants. Lichen simplex chronicus This type of eczema occurs when a person constantly scratches one area of the body. Repeated scratching of the area leads to thickened skin (lichenification). Lichen simplex chronicus can occur along with other types of eczema. It is more common in adults, but may be seen in children as well. Nummular eczema This is a common type of eczema. It has no known cause. It typically causes a red, circular, crusty lesion (plaque) that may be itchy. Scratching may become a habit and can cause bleeding. Nummular eczema occurs most often in people of middle-age or older. It most often affects the hands. Seborrheic dermatitis This is a common skin disease that mainly affects the scalp. It may also affect any oily areas of the body, such as the face, sides of nose, eyebrows, ears, eyelids, and chest. It is marked by small scaling and redness of the skin (erythema). This can affect people of all ages. In infants, this condition is known as Location manager"cradle cap." Stasis dermatitis This is a common skin disease that usually appears on the legs and feet. It most often occurs in people who have a condition that  prevents blood from being pumped through the veins in the legs (chronic venous insufficiency). Stasis dermatitis is a chronic condition that needs long-term management. How is eczema diagnosed? Your health care provider will examine your skin and review your medical history. He or she may also give you skin patch tests. These tests involve taking patches that contain possible allergens and placing them on your back. He or she will then check in a few days to see if an allergic reaction occurred. What are the common  treatments? Treatment for eczema is based on the type of eczema you have. Hydrocortisone steroid medicine can relieve itching quickly and help reduce inflammation. This medicine may be prescribed or obtained over-the-counter, depending on the strength of the medicine that is needed. Follow these instructions at home:  Take over-the-counter and prescription medicines only as told by your health care provider.  Use creams or ointments to moisturize your skin. Do not use lotions.  Learn what triggers or irritates your symptoms. Avoid these things.  Treat symptom flare-ups quickly.  Do not itch your skin. This can make your rash worse.  Keep all follow-up visits as told by your health care provider. This is important. Where to find more information  The American Academy of Dermatology: InfoExam.si  The National Eczema Association: www.nationaleczema.org Contact a health care provider if:  You have serious itching, even with treatment.  You regularly scratch your skin until it bleeds.  Your rash looks different than usual.  Your skin is painful, swollen, or more red than usual.  You have a fever. Summary  There are eight general types of eczema. Each type has different triggers.  Eczema of any type causes itching that may range from mild to severe.  Treatment varies based on the type of eczema you have. Hydrocortisone steroid medicine can help with itching and inflammation.  Protecting your skin is the best way to prevent eczema. Use moisturizers and lotions. Avoid triggers and irritants, and treat flare-ups quickly. This information is not intended to replace advice given to you by your health care provider. Make sure you discuss any questions you have with your health care provider. Document Released: 01/19/2017 Document Revised: 08/18/2017 Document Reviewed: 01/19/2017 Elsevier Patient Education  2020 [employer].  Tdap Vaccine (Tetanus, Diphtheria and Pertussis): What You  Need to Know 1. Why get vaccinated? Tetanus, diphtheria and pertussis are very serious diseases. Tdap vaccine can protect [place] from these diseases. And, Tdap vaccine given to pregnant women can protect newborn babies against pertussis.Marland Kitchen TETANUS (Lockjaw) is rare in the [place] today. It causes painful muscle tightening and stiffness, usually all over the body.  It can lead to tightening of muscles in the head and neck so you can't open your mouth, swallow, or sometimes even breathe. Tetanus kills about 1 out of 10 people who are infected even after receiving the best medical care. DIPHTHERIA is also rare in the [place] today. It can cause a thick coating to form in the back of the throat.  It can lead to breathing problems, heart failure, paralysis, and death. PERTUSSIS (Whooping Cough) causes severe coughing spells, which can cause difficulty breathing, vomiting and disturbed sleep.  It can also lead to weight loss, incontinence, and rib fractures. Up to 2 in 100 adolescents and 5 in 100 adults with pertussis are hospitalized or have complications, which could include pneumonia or death. These diseases are caused by bacteria. Diphtheria and pertussis are spread from person to person through secretions from coughing  or sneezing. Tetanus enters the body through cuts, scratches, or wounds. Before vaccines, as many as 200,000 cases of diphtheria, 200,000 cases of pertussis, and hundreds of cases of tetanus, were reported in the [place] each year. Since vaccination began, reports of cases for tetanus and diphtheria have dropped by about 99% and for pertussis by about 80%. 2. Tdap vaccine Tdap vaccine can protect adolescents and adults from tetanus, diphtheria, and pertussis. One dose of Tdap is routinely given at age 67 or 57. People who did not get Tdap at that age should get it as soon as possible. Tdap is especially important for healthcare professionals and anyone having close contact  with a baby younger than 12 months. Pregnant women should get a dose of Tdap during every pregnancy, to protect the newborn from pertussis. Infants are most at risk for severe, life-threatening complications from pertussis. Another vaccine, called Td, protects against tetanus and diphtheria, but not pertussis. A Td booster should be given every 10 years. Tdap may be given as one of these boosters if you have never gotten Tdap before. Tdap may also be given after a severe cut or burn to prevent tetanus infection. Your doctor or the person giving you the vaccine can give you more information. Tdap may safely be given at the same time as other vaccines. 3. Some people should not get this vaccine  A person who has ever had a life-threatening allergic reaction after a previous dose of any diphtheria, tetanus or pertussis containing vaccine, OR has a severe allergy to any part of this vaccine, should not get Tdap vaccine. Tell the person giving the vaccine about any severe allergies.  Anyone who had coma or long repeated seizures within 7 days after a childhood dose of DTP or DTaP, or a previous dose of Tdap, should not get Tdap, unless a cause other than the vaccine was found. They can still get Td.  Talk to your doctor if you: ? have seizures or another nervous system problem, ? had severe pain or swelling after any vaccine containing diphtheria, tetanus or pertussis, ? ever had a condition called Guillain-Barr Syndrome (GBS), ? aren't feeling well on the day the shot is scheduled. 4. Risks With any medicine, including vaccines, there is a chance of side effects. These are usually mild and go away on their own. Serious reactions are also possible but are rare. Most people who get Tdap vaccine do not have any problems with it. Mild problems following Tdap (Did not interfere with activities)  Pain where the shot was given (about 3 in 4 adolescents or 2 in 3 adults)  Redness or swelling where the  shot was given (about 1 person in 5)  Mild fever of at least 100.29F (up to about 1 in 25 adolescents or 1 in 100 adults)  Headache (about 3 or 4 people in 10)  Tiredness (about 1 person in 3 or 4)  Nausea, vomiting, diarrhea, stomach ache (up to 1 in 4 adolescents or 1 in 10 adults)  Chills, sore joints (about 1 person in 10)  Body aches (about 1 person in 3 or 4)  Rash, swollen glands (uncommon) Moderate problems following Tdap (Interfered with activities, but did not require medical attention)  Pain where the shot was given (up to 1 in 5 or 6)  Redness or swelling where the shot was given (up to about 1 in 16 adolescents or 1 in 12 adults)  Fever over 102F (about 1 in 100 adolescents or  1 in 250 adults)  Headache (about 1 in 7 adolescents or 1 in 10 adults)  Nausea, vomiting, diarrhea, stomach ache (up to 1 or 3 people in 100)  Swelling of the entire arm where the shot was given (up to about 1 in 500). Severe problems following Tdap (Unable to perform usual activities; required medical attention)  Swelling, severe pain, bleeding and redness in the arm where the shot was given (rare). Problems that could happen after any vaccine:  People sometimes faint after a medical procedure, including vaccination. Sitting or lying down for about 15 minutes can help prevent fainting, and injuries caused by a fall. Tell your doctor if you feel dizzy, or have vision changes or ringing in the ears.  Some people get severe pain in the shoulder and have difficulty moving the arm where a shot was given. This happens very rarely.  Any medication can cause a severe allergic reaction. Such reactions from a vaccine are very rare, estimated at fewer than 1 in a million doses, and would happen within a few minutes to a few hours after the vaccination. As with any medicine, there is a very remote chance of a vaccine causing a serious injury or death. The safety of vaccines is always being  monitored. For more information, visit: http://www.aguilar.org/ 5. What if there is a serious problem? What should I look for?  Look for anything that concerns you, such as signs of a severe allergic reaction, very high fever, or unusual behavior. Signs of a severe allergic reaction can include hives, swelling of the face and throat, difficulty breathing, a fast heartbeat, dizziness, and weakness. These would usually start a few minutes to a few hours after the vaccination. What should I do?  If you think it is a severe allergic reaction or other emergency that can't wait, call 9-1-1 or get the person to the nearest hospital. Otherwise, call your doctor.  Afterward, the reaction should be reported to the Vaccine Adverse Event Reporting System (VAERS). Your doctor might file this report, or you can do it yourself through the VAERS web site at www.vaers.SamedayNews.es, or by calling (208)406-8992. VAERS does not give medical advice. 6. The National Vaccine Injury Compensation Program The [employer] Vaccine Injury Compensation Program (VICP) is a federal program that was created to compensate people who may have been injured by certain vaccines. Persons who believe they may have been injured by a vaccine can learn about the program and about filing a claim by calling 702-714-0564 or visiting the [place] website at GoldCloset.com.ee. There is a time limit to file a claim for compensation. 7. How can I learn more?  Ask your doctor. He or she can give you the vaccine package insert or suggest other sources of information.  Call your local or state health department.  Contact the Centers for Disease Control and Prevention (CDC): ? Call (531)274-4939 (1-800-CDC-INFO) or ? Visit CDC's website at http://hunter.com/ Vaccine Information Statement Tdap Vaccine (11/12/2013) This information is not intended to replace advice given to you by your health care provider. Make sure you discuss any  questions you have with your health care provider. Document Released: 03/06/2012 Document Revised: 04/23/2018 Document Reviewed: 04/23/2018 Elsevier Interactive Patient Education  [employer].

## 2019-07-31 ENCOUNTER — Encounter: Payer: Self-pay | Admitting: Internal Medicine

## 2019-10-02 ENCOUNTER — Telehealth: Payer: Self-pay | Admitting: Internal Medicine

## 2019-10-02 NOTE — Telephone Encounter (Signed)
I called pt twice and left a vm to call ofc. °

## 2019-10-04 ENCOUNTER — Other Ambulatory Visit: Payer: Self-pay

## 2019-10-04 ENCOUNTER — Ambulatory Visit (INDEPENDENT_AMBULATORY_CARE_PROVIDER_SITE_OTHER): Payer: 59 | Admitting: Internal Medicine

## 2019-10-04 VITALS — BP 124/78 | Ht 65.0 in | Wt 129.0 lb

## 2019-10-04 DIAGNOSIS — H04123 Dry eye syndrome of bilateral lacrimal glands: Secondary | ICD-10-CM

## 2019-10-04 DIAGNOSIS — R059 Cough, unspecified: Secondary | ICD-10-CM

## 2019-10-04 DIAGNOSIS — J9801 Acute bronchospasm: Secondary | ICD-10-CM | POA: Diagnosis not present

## 2019-10-04 DIAGNOSIS — Z91018 Allergy to other foods: Secondary | ICD-10-CM

## 2019-10-04 DIAGNOSIS — T782XXD Anaphylactic shock, unspecified, subsequent encounter: Secondary | ICD-10-CM | POA: Diagnosis not present

## 2019-10-04 DIAGNOSIS — M7918 Myalgia, other site: Secondary | ICD-10-CM | POA: Diagnosis not present

## 2019-10-04 DIAGNOSIS — Z23 Encounter for immunization: Secondary | ICD-10-CM | POA: Diagnosis not present

## 2019-10-04 DIAGNOSIS — R0781 Pleurodynia: Secondary | ICD-10-CM

## 2019-10-04 DIAGNOSIS — R41840 Attention and concentration deficit: Secondary | ICD-10-CM

## 2019-10-04 DIAGNOSIS — M542 Cervicalgia: Secondary | ICD-10-CM

## 2019-10-04 DIAGNOSIS — R0789 Other chest pain: Secondary | ICD-10-CM

## 2019-10-04 DIAGNOSIS — R05 Cough: Secondary | ICD-10-CM

## 2019-10-04 MED ORDER — EPINEPHRINE 0.3 MG/0.3ML IJ SOAJ: 0.3000 mg | INTRAMUSCULAR | 2 refills | Status: AC | PRN

## 2019-10-04 MED ORDER — TETANUS-DIPHTH-ACELL PERTUSSIS 5-2.5-18.5 LF-MCG/0.5 IM SUSP: 0.5000 mL | Freq: Once | INTRAMUSCULAR | 0 refills | Status: AC
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
## 2019-10-06 ENCOUNTER — Encounter: Payer: Self-pay | Admitting: Internal Medicine

## 2019-10-06 DIAGNOSIS — M542 Cervicalgia: Secondary | ICD-10-CM | POA: Insufficient documentation

## 2019-10-06 DIAGNOSIS — M7918 Myalgia, other site: Secondary | ICD-10-CM | POA: Insufficient documentation

## 2019-10-07 ENCOUNTER — Telehealth: Payer: Self-pay | Admitting: *Deleted

## 2019-10-07 NOTE — Telephone Encounter (Signed)
Called Pt and she stated she was already notified of lab order, from Dr French Ana McleanScocuzza

## 2019-10-07 NOTE — Telephone Encounter (Signed)
-----   Message from Bevelyn Buckles, MD sent at 10/06/2019  9:23 PM EST ----- Labs ordered 07/2019 pt did not get [facility] Please call pt to have fasting labs [facility] and she is to come to front and pick up lab form [facility]

## 2019-10-08 NOTE — Progress Notes (Signed)
Labs Placed at front desk on 10/07/19.

## 2019-10-11 ENCOUNTER — Ambulatory Visit
Admission: RE | Admit: 2019-10-11 | Discharge: 2019-10-11 | Disposition: A | Discharge status: Home / Self Care | Payer: 59 | Attending: Internal Medicine | Admitting: Internal Medicine

## 2019-10-11 ENCOUNTER — Other Ambulatory Visit: Payer: Self-pay

## 2019-10-11 ENCOUNTER — Ambulatory Visit
Admission: RE | Admit: 2019-10-11 | Discharge: 2019-10-11 | Disposition: A | Discharge status: Home / Self Care | Payer: 59 | Source: Ambulatory Visit | Attending: Internal Medicine | Admitting: Internal Medicine

## 2019-10-11 DIAGNOSIS — J9801 Acute bronchospasm: Secondary | ICD-10-CM | POA: Insufficient documentation

## 2019-10-15 LAB — COMPREHENSIVE METABOLIC PANEL
ALT: 7 IU/L (ref 0–32)
AST: 13 IU/L (ref 0–40)
Albumin/Globulin Ratio: 1.7 (ref 1.2–2.2)
Albumin: 4.5 g/dL (ref 3.9–5.0)
Alkaline Phosphatase: 55 IU/L (ref 39–117)
BUN/Creatinine Ratio: 6 — ABNORMAL LOW (ref 9–23)
BUN: 4 mg/dL — ABNORMAL LOW (ref 6–20)
Bilirubin Total: 0.4 mg/dL (ref 0.0–1.2)
CO2: 24 mmol/L (ref 20–29)
Calcium: 9.3 mg/dL (ref 8.7–10.2)
Chloride: 106 mmol/L (ref 96–106)
Creatinine, Ser: 0.7 mg/dL (ref 0.57–1.00)
GFR calc Af Amer: 135 mL/min/{1.73_m2} (ref >59)
GFR calc non Af Amer: 117 mL/min/{1.73_m2} (ref >59)
Globulin, Total: 2.7 g/dL (ref 1.5–4.5)
Glucose: 90 mg/dL (ref 65–99)
Potassium: 4.4 mmol/L (ref 3.5–5.2)
Sodium: 141 mmol/L (ref 134–144)
Total Protein: 7.2 g/dL (ref 6.0–8.5)

## 2019-10-15 LAB — CBC WITH DIFFERENTIAL/PLATELET
Basophils Absolute: 0 10*3/uL (ref 0.0–0.2)
Basos: 1 %
EOS (ABSOLUTE): 0.1 10*3/uL (ref 0.0–0.4)
Eos: 1 %
Hematocrit: 40.4 % (ref 34.0–46.6)
Hemoglobin: 13.4 g/dL (ref 11.1–15.9)
Immature Grans (Abs): 0 10*3/uL (ref 0.0–0.1)
Immature Granulocytes: 0 %
Lymphocytes Absolute: 0.9 10*3/uL (ref 0.7–3.1)
Lymphs: 24 %
MCH: 31.7 pg (ref 26.6–33.0)
MCHC: 33.2 g/dL (ref 31.5–35.7)
MCV: 96 fL (ref 79–97)
Monocytes Absolute: 0.2 10*3/uL (ref 0.1–0.9)
Monocytes: 4 %
Neutrophils Absolute: 2.6 10*3/uL (ref 1.4–7.0)
Neutrophils: 70 %
Platelets: 248 10*3/uL (ref 150–450)
RBC: 4.23 x10E6/uL (ref 3.77–5.28)
RDW: 11.4 % — ABNORMAL LOW (ref 11.7–15.4)
WBC: 3.7 10*3/uL (ref 3.4–10.8)

## 2019-10-15 LAB — CYCLIC CITRUL PEPTIDE ANTIBODY, IGG/IGA: Cyclic Citrullin Peptide Ab: 3 units (ref 0–19)

## 2019-10-15 LAB — LIPID PANEL
Chol/HDL Ratio: 2.7 ratio (ref 0.0–4.4)
Cholesterol, Total: 186 mg/dL (ref 100–199)
HDL: 70 mg/dL (ref >39)
LDL Chol Calc (NIH): 105 mg/dL — ABNORMAL HIGH (ref 0–99)
Triglycerides: 59 mg/dL (ref 0–149)
VLDL Cholesterol Cal: 11 mg/dL (ref 5–40)

## 2019-10-15 LAB — URINALYSIS, ROUTINE W REFLEX MICROSCOPIC
Bilirubin, UA: NEGATIVE
Glucose, UA: NEGATIVE
Ketones, UA: NEGATIVE
Leukocytes,UA: NEGATIVE
Nitrite, UA: NEGATIVE
Protein,UA: NEGATIVE
RBC, UA: NEGATIVE
Specific Gravity, UA: 1.012 (ref 1.005–1.030)
Urobilinogen, Ur: 0.2 mg/dL (ref 0.2–1.0)
pH, UA: 7 (ref 5.0–7.5)

## 2019-10-15 LAB — ANA W/REFLEX IF POSITIVE: Anti Nuclear Antibody (ANA): NEGATIVE

## 2019-10-15 LAB — SEDIMENTATION RATE: Sed Rate: 7 mm/hr (ref 0–32)

## 2019-10-15 LAB — VITAMIN D 25 HYDROXY (VIT D DEFICIENCY, FRACTURES): Vit D, 25-Hydroxy: 29.3 ng/mL — ABNORMAL LOW (ref 30.0–100.0)

## 2019-10-15 LAB — T4, FREE: Free T4: 1.2 ng/dL (ref 0.82–1.77)

## 2019-10-15 LAB — RHEUMATOID FACTOR: Rheumatoid fact SerPl-aCnc: <10 IU/mL (ref 0.0–13.9)

## 2019-10-15 LAB — SJOGREN'S SYNDROME ANTIBODS(SSA + SSB)
ENA SSA (RO) Ab: <0.2 AI (ref 0.0–0.9)
ENA SSB (LA) Ab: <0.2 AI (ref 0.0–0.9)

## 2019-10-15 LAB — T3, FREE: T3, Free: 2.8 pg/mL (ref 2.0–4.4)

## 2019-10-15 LAB — C-REACTIVE PROTEIN: CRP: <1 mg/L (ref 0–10)

## 2019-10-15 LAB — TSH: TSH: 0.638 u[IU]/mL (ref 0.450–4.500)

## 2019-10-15 LAB — ANTINUCLEAR ANTIBODIES, IFA: ANA Titer 1: NEGATIVE

## 2019-12-04 ENCOUNTER — Encounter: Payer: Self-pay | Admitting: Internal Medicine

## 2019-12-23 NOTE — Telephone Encounter (Signed)
Pt called in and said that [facility] was in network and will you please send the referral for the ADHD testing.

## 2019-12-30 NOTE — Addendum Note (Signed)
Addended by: Quentin Ore on: 12/30/2019 12:10 PM   Modules accepted: Orders

## 2020-01-08 ENCOUNTER — Ambulatory Visit: Payer: 59 | Admitting: Internal Medicine

## 2020-07-30 ENCOUNTER — Ambulatory Visit (INDEPENDENT_AMBULATORY_CARE_PROVIDER_SITE_OTHER): Payer: Self-pay | Admitting: Internal Medicine

## 2020-07-30 ENCOUNTER — Other Ambulatory Visit: Payer: Self-pay

## 2020-07-30 ENCOUNTER — Encounter: Payer: Self-pay | Admitting: Internal Medicine

## 2020-07-30 VITALS — BP 116/78 | HR 92 | Temp 98.3°F | Ht 65.0 in | Wt 129.0 lb

## 2020-07-30 DIAGNOSIS — R5383 Other fatigue: Secondary | ICD-10-CM

## 2020-07-30 DIAGNOSIS — R591 Generalized enlarged lymph nodes: Secondary | ICD-10-CM

## 2020-07-30 DIAGNOSIS — G894 Chronic pain syndrome: Secondary | ICD-10-CM

## 2020-07-30 DIAGNOSIS — R002 Palpitations: Secondary | ICD-10-CM

## 2020-07-30 DIAGNOSIS — M791 Myalgia, unspecified site: Secondary | ICD-10-CM

## 2020-07-30 DIAGNOSIS — T781XXA Other adverse food reactions, not elsewhere classified, initial encounter: Secondary | ICD-10-CM

## 2020-07-30 DIAGNOSIS — M542 Cervicalgia: Secondary | ICD-10-CM

## 2020-07-30 DIAGNOSIS — R519 Headache, unspecified: Secondary | ICD-10-CM

## 2020-07-30 DIAGNOSIS — Q859 Phakomatosis, unspecified: Secondary | ICD-10-CM

## 2020-07-30 DIAGNOSIS — M7918 Myalgia, other site: Secondary | ICD-10-CM

## 2020-07-30 DIAGNOSIS — R1084 Generalized abdominal pain: Secondary | ICD-10-CM

## 2020-07-30 DIAGNOSIS — F419 Anxiety disorder, unspecified: Secondary | ICD-10-CM | POA: Insufficient documentation

## 2020-07-30 DIAGNOSIS — G8929 Other chronic pain: Secondary | ICD-10-CM

## 2020-07-30 DIAGNOSIS — J45990 Exercise induced bronchospasm: Secondary | ICD-10-CM

## 2020-07-30 DIAGNOSIS — Z91018 Allergy to other foods: Secondary | ICD-10-CM

## 2020-07-30 DIAGNOSIS — F909 Attention-deficit hyperactivity disorder, unspecified type: Secondary | ICD-10-CM

## 2020-07-30 MED ORDER — NORTRIPTYLINE HCL 10 MG PO CAPS: 10.0000 mg | ORAL_CAPSULE | Freq: Every day | ORAL | 0 refills | Status: DC

## 2020-07-30 MED ORDER — TIZANIDINE HCL 2 MG PO TABS: 2.0000 mg | ORAL_TABLET | Freq: Every evening | ORAL | 0 refills | Status: DC | PRN

## 2020-07-30 NOTE — Patient Instructions (Addendum)
[facility] rheumatology Dr. Dierdre Forth  Dr. Loni Beckwith  [facility]   Magnesium 250 mg daily   We can consider neurology in the future   cymbalta can help anxiety    [facility] [address]   Neck Exercises Ask your health care provider which exercises are safe for you. Do exercises exactly as told by your health care provider and adjust them as directed. It is normal to feel mild stretching, pulling, tightness, or discomfort as you do these exercises. Stop right away if you feel sudden pain or your pain gets worse. Do not begin these exercises until told by your health care provider. Neck exercises can be important for many reasons. They can improve strength and maintain flexibility in your neck, which will help your upper back and prevent neck pain. Stretching exercises Rotation neck stretching  1. Sit in a chair or stand up. 2. Place your feet flat on the floor, shoulder width apart. 3. Slowly turn your head (rotate) to the right until a slight stretch is felt. Turn it all the way to the right so you can look over your right shoulder. Do not tilt or tip your head. 4. Hold this position for 10-30 seconds. 5. Slowly turn your head (rotate) to the left until a slight stretch is felt. Turn it all the way to the left so you can look over your left shoulder. Do not tilt or tip your head. 6. Hold this position for 10-30 seconds. Repeat __________ times. Complete this exercise __________ times a day. Neck retraction 1. Sit in a sturdy chair or stand up. 2. Look straight ahead. Do not bend your neck. 3. Use your fingers to push your chin backward (retraction). Do not bend your neck for this movement. Continue to face straight ahead. If you are doing the exercise properly, you will feel a slight sensation in your throat and a stretch at the back of your neck. 4. Hold the stretch for 1-2 seconds. Repeat __________ times. Complete this exercise __________ times a day. Strengthening  exercises Neck press 1. Lie on your back on a firm bed or on the floor with a pillow under your head. 2. Use your neck muscles to push your head down on the pillow and straighten your spine. 3. Hold the position as well as you can. Keep your head facing up (in a neutral position) and your chin tucked. 4. Slowly count to 5 while holding this position. Repeat __________ times. Complete this exercise __________ times a day. Isometrics These are exercises in which you strengthen the muscles in your neck while keeping your neck still (isometrics). 1. Sit in a supportive chair and place your hand on your forehead. 2. Keep your head and face facing straight ahead. Do not flex or extend your neck while doing isometrics. 3. Push forward with your head and neck while pushing back with your hand. Hold for 10 seconds. 4. Do the sequence again, this time putting your hand against the back of your head. Use your head and neck to push backward against the hand pressure. 5. Finally, do the same exercise on either side of your head, pushing sideways against the pressure of your hand. Repeat __________ times. Complete this exercise __________ times a day. Prone head lifts 1. Lie face-down (prone position), resting on your elbows so that your chest and upper back are raised. 2. Start with your head facing downward, near your chest. Position your chin either on or near your chest. 3. Slowly lift  your head upward. Lift until you are looking straight ahead. Then continue lifting your head as far back as you can comfortably stretch. 4. Hold your head up for 5 seconds. Then slowly lower it to your starting position. Repeat __________ times. Complete this exercise __________ times a day. Supine head lifts 1. Lie on your back (supine position), bending your knees to point to the ceiling and keeping your feet flat on the floor. 2. Lift your head slowly off the floor, raising your chin toward your chest. 3. Hold for 5  seconds. Repeat __________ times. Complete this exercise __________ times a day. Scapular retraction 1. Stand with your arms at your sides. Look straight ahead. 2. Slowly pull both shoulders (scapulae) backward and downward (retraction) until you feel a stretch between your shoulder blades in your upper back. 3. Hold for 10-30 seconds. 4. Relax and repeat. Repeat __________ times. Complete this exercise __________ times a day. Contact a health care provider if:  Your neck pain or discomfort gets much worse when you do an exercise.  Your neck pain or discomfort does not improve within 2 hours after you exercise. If you have any of these problems, stop exercising right away. Do not do the exercises again unless your health care provider says that you can. Get help right away if:  You develop sudden, severe neck pain. If this happens, stop exercising right away. Do not do the exercises again unless your health care provider says that you can. This information is not intended to replace advice given to you by your health care provider. Make sure you discuss any questions you have with your health care provider. Document Revised: 07/04/2018 Document Reviewed: 07/04/2018 Elsevier Patient Education  2020 [employer].  Hamstring Strain Rehab Ask your health care provider which exercises are safe for you. Do exercises exactly as told by your health care provider and adjust them as directed. It is normal to feel mild stretching, pulling, tightness, or discomfort as you do these exercises. Stop right away if you feel sudden pain or your pain gets worse. Do not begin these exercises until told by your health care provider. Stretching and range-of-motion exercises These exercises warm up your muscles and joints and improve the movement and flexibility of your thighs. These exercises also help to relieve pain, numbness, and tingling. Talk to your health care provider about these restrictions. Knee  extension, seated  1. Sit with your left / right heel propped on a chair, a coffee table, or a footstool. Do not have anything under your knee to support it. 2. Allow your leg muscles to relax, letting gravity straighten out your knee (extension). You should feel a stretch behind your left / right knee. 3. If told by your health care provider, deepen the stretch by placing a __________ weight on your thigh, just above your kneecap. 4. Hold this position for __________ seconds. Repeat __________ times. Complete this exercise __________ times a day. Seated stretch This exercise is sometimes called hamstrings and adductors stretch. 1. Sit on the floor with your legs stretched wide. Keep your knees straight during this exercise. 2. Keeping your head and back in a straight line, bend at your waist to reach for your left foot (position A). You should feel a stretch in your right inner thigh (adductors). 3. Hold this position for __________ seconds. Then slowly return to the upright position. 4. Keeping your head and back in a straight line, bend at your waist to reach forward (position B).  You should feel a stretch behind both of your thighs or knees (hamstrings). 5. Hold this position for __________ seconds. Then slowly return to the upright position. 6. Keeping your head and back in a straight line, bend at your waist to reach for your right foot (position C). You should feel a stretch in your left inner thigh (adductors). 7. Hold this position for __________ seconds. Then slowly return to the upright position. Repeat __________ times. Complete this exercise __________ times a day. Hamstrings stretch, supine  1. Lie on your back (supine position). 2. Loop a belt or towel over the ball of your left / right foot. The ball of your foot is on the walking surface, right under your toes. 3. Straighten your left / right knee and slowly pull on the belt or towel to raise your leg. ? Do not let your left /  right knee bend while you do this. ? Keep your other leg flat on the floor. ? Raise the left / right leg until you feel a gentle stretch behind your left / right knee or thigh (hamstrings). 4. Hold this position for __________ seconds. 5. Slowly return your leg to the starting position. Repeat __________ times. Complete this exercise __________ times a day. Strengthening exercises These exercises build strength and endurance in your thighs. Endurance is the ability to use your muscles for a long time, even after they get tired. Straight leg raises, prone This exercise strengthens the muscles that move the hips (hip extensors). 1. Lie on your abdomen on a firm surface (prone position). 2. Tense the muscles in your buttocks and lift your left / right leg about 4 inches (10 cm). Keep your knee straight as you lift your leg. If you cannot lift your leg that high without arching your back, place a pillow under your hips. 3. Hold the position for __________ seconds. 4. Slowly lower your leg to the starting position. 5. Allow your muscles to relax completely before you start the next repetition. Repeat __________ times. Complete this exercise __________ times a day. Bridge This exercise strengthens the muscles in your buttocks and the back of your thighs (hip extensors). 1. Lie on your back on a firm surface with your knees bent and your feet flat on the floor. 2. Tighten your buttocks muscles and lift your bottom off the floor until the trunk of your body is level with your thighs. ? You should feel the muscles working in your buttocks and the back of your thighs. ? Do not arch your back. 3. Hold this position for __________ seconds. 4. Slowly lower your hips to the starting position. 5. Let your buttocks muscles relax completely between repetitions. 6. If told by your health care provider, keep your bottom lifted off the floor while you slowly walk your feet away from you as far as you can  control. Hold for __________ seconds, then slowly walk your feet back toward you. Repeat __________ times. Complete this exercise __________ times a day. Lateral walking with band This is an exercise in which you walk sideways (lateral), with tension provided by an exercise band. The exercise strengthens the muscles in your hip (hip abductors). 1. Stand in a long hallway. 2. Wrap a loop of exercise band around your legs, just above your knees. 3. Bend your knees gently and drop your hips down and back so your weight is over your heels. 4. Step to the side to move down the length of the hallway, keeping your toes  pointed ahead of you and keeping tension in the band. 5. Repeat, leading with your other leg. Repeat __________ times. Complete this exercise __________ times a day. Single leg stand with reaching This exercise is also called eccentric hamstring stretch. 1. Stand on your left / right foot. Keep your big toe down on the floor and try to keep your arch lifted. 2. Slowly reach down toward the floor as far as you can while keeping your balance. Lowering your thigh under tension is called eccentric stretching. 3. Hold this position for __________ seconds. Repeat __________ times. Complete this exercise __________ times a day. Plank, prone This exercise strengthens muscles in your abdomen and core area. 1. Lie on your abdomen on the floor (prone position),and prop yourself up on your elbows. Your hands should be straight out in front of you, and your elbows should be below your shoulders. Position your feet similar to a push-up position so your toes are on the ground. 2. Tighten your abdominal muscles and lift your body off the floor. ? Do not arch your back. ? Do not hold your breath. 3. Hold this position for __________ seconds. Repeat __________ times. Complete this exercise __________ times a day. This information is not intended to replace advice given to you by your health care  provider. Make sure you discuss any questions you have with your health care provider. Document Revised: 12/27/2018 Document Reviewed: 09/03/2018 Elsevier Patient Education  2020 Else[employer] Inc.   Myofascial Pain Syndrome and Fibromyalgia Myofascial pain syndrome and fibromyalgia are both pain disorders. This pain may be felt mainly in your muscles.  Myofascial pain syndrome: ? Always has tender points in the muscle that will cause pain when pressed (trigger points). The pain may come and go. ? Usually affects your neck, upper back, and shoulder areas. The pain often radiates into your arms and hands.  Fibromyalgia: ? Has muscle pains and tenderness that come and go. ? Is often associated with fatigue and sleep problems. ? Has trigger points. ? Tends to be long-lasting (chronic), but is not life-threatening. Fibromyalgia and myofascial pain syndrome are not the same. However, they often occur together. If you have both conditions, each can make the other worse. Both are common and can cause enough pain and fatigue to make day-to-day activities difficult. Both can be hard to diagnose because their symptoms are common in many other conditions. What are the causes? The exact causes of these conditions are not known. What increases the risk? You are more likely to develop this condition if:  You have a family history of the condition.  You have certain triggers, such as: ? Spine disorders. ? An injury (trauma) or other physical stressors. ? Being under a lot of stress. ? Medical conditions such as osteoarthritis, rheumatoid arthritis, or lupus. What are the signs or symptoms? Fibromyalgia The main symptom of fibromyalgia is widespread pain and tenderness in your muscles. Pain is sometimes described as stabbing, shooting, or burning. You may also have:  Tingling or numbness.  Sleep problems and fatigue.  Problems with attention and concentration (fibro fog). Other symptoms may  include:  Bowel and bladder problems.  Headaches.  Visual problems.  Problems with odors and noises.  Depression or mood changes.  Painful menstrual periods (dysmenorrhea).  Dry skin or eyes. These symptoms can vary over time. Myofascial pain syndrome Symptoms of myofascial pain syndrome include:  Tight, ropy bands of muscle.  Uncomfortable sensations in muscle areas. These may include aching, cramping, burning, numbness,  tingling, and weakness.  Difficulty moving certain parts of the body freely (poor range of motion). How is this diagnosed? This condition may be diagnosed by your symptoms and medical history. You will also have a physical exam. In general:  Fibromyalgia is diagnosed if you have pain, fatigue, and other symptoms for more than 3 months, and symptoms cannot be explained by another condition.  Myofascial pain syndrome is diagnosed if you have trigger points in your muscles, and those trigger points are tender and cause pain elsewhere in your body (referred pain). How is this treated? Treatment for these conditions depends on the type that you have.  For fibromyalgia: ? Pain medicines, such as NSAIDs. ? Medicines for treating depression. ? Medicines for treating seizures. ? Medicines that relax the muscles.  For myofascial pain: ? Pain medicines, such as NSAIDs. ? Cooling and stretching of muscles. ? Trigger point injections. ? Sound wave (ultrasound) treatments to stimulate muscles. Treating these conditions often requires a team of health care providers. These may include:  Your primary care provider.  Physical therapist.  Complementary health care providers, such as massage therapists or acupuncturists.  Psychiatrist for cognitive behavioral therapy. Follow these instructions at home: Medicines  Take over-the-counter and prescription medicines only as told by your health care provider.  Do not drive or use heavy machinery while taking  prescription pain medicine.  If you are taking prescription pain medicine, take actions to prevent or treat constipation. Your health care provider may recommend that you: ? Drink enough fluid to keep your urine pale yellow. ? Eat foods that are high in fiber, such as fresh fruits and vegetables, whole grains, and beans. ? Limit foods that are high in fat and processed sugars, such as fried or sweet foods. ? Take an over-the-counter or prescription medicine for constipation. Lifestyle   Exercise as directed by your health care provider or physical therapist.  Practice relaxation techniques to control your stress. You may want to try: ? Biofeedback. ? Visual imagery. ? Hypnosis. ? Muscle relaxation. ? Yoga. ? Meditation.  Maintain a healthy lifestyle. This includes eating a healthy diet and getting enough sleep.  Do not use any products that contain nicotine or tobacco, such as cigarettes and e-cigarettes. If you need help quitting, ask your health care provider. General instructions  Talk to your health care provider about complementary treatments, such as acupuncture or massage.  Consider joining a support group with others who are diagnosed with this condition.  Do not do activities that stress or strain your muscles. This includes repetitive motions and heavy lifting.  Keep all follow-up visits as told by your health care provider. This is important. Where to find more information  National Fibromyalgia Association: www.fmaware.org  Arthritis Foundation: www.arthritis.org  American Chronic Pain Association: www.theacpa.org Contact a health care provider if:  You have new symptoms.  Your symptoms get worse or your pain is severe.  You have side effects from your medicines.  You have trouble sleeping.  Your condition is causing depression or anxiety. Summary  Myofascial pain syndrome and fibromyalgia are pain disorders.  Myofascial pain syndrome has tender  points in the muscle that will cause pain when pressed (trigger points). Fibromyalgia also has muscle pains and tenderness that come and go, but this condition is often associated with fatigue and sleep disturbances.  Fibromyalgia and myofascial pain syndrome are not the same but often occur together, causing pain and fatigue that make day-to-day activities difficult.  Treatment for fibromyalgia includes taking  medicines to relax the muscles and medicines for pain, depression, or seizures. Treatment for myofascial pain syndrome includes taking medicines for pain, cooling and stretching of muscles, and injecting medicines into trigger points.  Follow your health care provider's instructions for taking medicines and maintaining a healthy lifestyle. This information is not intended to replace advice given to you by your health care provider. Make sure you discuss any questions you have with your health care provider. Document Revised: 12/28/2018 Document Reviewed: 09/20/2017 Elsevier Patient Education  2020 Else[employer] Inc.   Tizanidine tablets or capsules What is this medicine? TIZANIDINE (tye ZAN i deen) helps to relieve muscle spasms. It may be used to help in the treatment of multiple sclerosis and spinal cord injury. This medicine may be used for other purposes; ask your health care provider or pharmacist if you have questions. COMMON BRAND NAME(S): Zanaflex What should I tell my health care provider before I take this medicine? They need to know if you have any of these conditions:  kidney disease  liver disease  low blood pressure  mental disorder  an unusual or allergic reaction to tizanidine, other medicines, lactose (tablets only), foods, dyes, or preservatives  pregnant or trying to get pregnant  breast-feeding How should I use this medicine? Take this medicine by mouth with a full glass of water. Take this medicine on an empty stomach, at least 30 minutes before or 2 hours  after food. Do not take with food unless you talk with your doctor. Follow the directions on the prescription label. Take your medicine at regular intervals. Do not take your medicine more often than directed. Do not stop taking except on your doctor's advice. Suddenly stopping the medicine can be very dangerous. Talk to your pediatrician regarding the use of this medicine in children. Patients over 13 years old may have a stronger reaction and need a smaller dose. Overdosage: If you think you have taken too much of this medicine contact a poison control center or emergency room at once. NOTE: This medicine is only for you. Do not share this medicine with others. What if I miss a dose? If you miss a dose, take it as soon as you can. If it is almost time for your next dose, take only that dose. Do not take double or extra doses. What may interact with this medicine? Do not take this medicine with any of the following medications:  ciprofloxacin  fluvoxamine  narcotic medicines for cough  thiabendazole This medicine may also interact with the following medications:  acyclovir  alcohol  antihistamines for allergy, cough, and cold  baclofen  certain medicines for anxiety or sleep  certain medicines for blood pressure, heart disease, irregular heartbeat  certain medicines for depression like amitriptyline, fluoxetine, sertraline  certain medicines for seizures like phenobarbital, primidone  certain medicines for stomach problems like cimetidine, famotidine  female hormones, like estrogens or progestins and birth control pills, patches, rings, or injections  general anesthetics like halothane, isoflurane, methoxyflurane, propofol  local anesthetics like lidocaine, pramoxine, tetracaine  medicines that relax muscles for surgery  narcotic medicines for pain  phenothiazines like chlorpromazine, mesoridazine, prochlorperazine  ticlopidine  zileuton This list may not describe  all possible interactions. Give your health care provider a list of all the medicines, herbs, non-prescription drugs, or dietary supplements you use. Also tell them if you smoke, drink alcohol, or use illegal drugs. Some items may interact with your medicine. What should I watch for while using this medicine? Tell  your doctor or health care professional if your symptoms do not start to get better or if they get worse. You may get drowsy or dizzy. Do not drive, use machinery, or do anything that needs mental alertness until you know how this medicine affects you. Do not stand or sit up quickly, especially if you are an older patient. This reduces the risk of dizzy or fainting spells. Alcohol may interfere with the effect of this medicine. Avoid alcoholic drinks. If you are taking another medicine that also causes drowsiness, you may have more side effects. Give your health care provider a list of all medicines you use. Your doctor will tell you how much medicine to take. Do not take more medicine than directed. Call emergency for help if you have problems breathing or unusual sleepiness. Your mouth may get dry. Chewing sugarless gum or sucking hard candy, and drinking plenty of water may help. Contact your doctor if the problem does not go away or is severe. What side effects may I notice from receiving this medicine? Side effects that you should report to your doctor or health care professional as soon as possible:  allergic reactions like skin rash, itching or hives, swelling of the face, lips, or tongue  breathing problems  hallucinations  signs and symptoms of liver injury like dark yellow or brown urine; general ill feeling or flu-like symptoms; light-colored stools; loss of appetite; nausea; right upper quadrant belly pain; unusually weak or tired; yellowing of the eyes or skin  signs and symptoms of low blood pressure like dizziness; feeling faint or lightheaded, falls; unusually weak or  tired  unusually slow heartbeat  unusually weak or tired Side effects that usually do not require medical attention (report to your doctor or health care professional if they continue or are bothersome):  blurred vision  constipation  dizziness  dry mouth  tiredness This list may not describe all possible side effects. Call your doctor for medical advice about side effects. You may report side effects to FDA at 1-800-FDA-1088. Where should I keep my medicine? Keep out of the reach of children. Store at room temperature between 15 and 30 degrees C (59 and 86 degrees F). Throw away any unused medicine after the expiration date. NOTE: This sheet is a summary. It may not cover all possible information. If you have questions about this medicine, talk to your doctor, pharmacist, or health care provider.  2020 Elsevier/Gold Standard (2017-06-20 13:33:29)     Nortriptyline capsules What is this medicine? NORTRIPTYLINE (nor TRIP ti leen) is used to treat depression. This medicine may be used for other purposes; ask your health care provider or pharmacist if you have questions. COMMON BRAND NAME(S): Aventyl, Pamelor What should I tell my health care provider before I take this medicine? They need to know if you have any of these conditions:  bipolar disorder  Brugada syndrome  difficulty passing urine  glaucoma  heart disease  if you drink alcohol  liver disease  schizophrenia  seizures  suicidal thoughts, plans or attempt; a previous suicide attempt by you or a family member  thyroid disease  an unusual or allergic reaction to nortriptyline, other tricyclic antidepressants, other medicines, foods, dyes, or preservatives  pregnant or trying to get pregnant  breast-feeding How should I use this medicine? Take this medicine by mouth with a glass of water. Follow the directions on the prescription label. Take your doses at regular intervals. Do not take it more often  than directed. Do  not stop taking this medicine suddenly except upon the advice of your doctor. Stopping this medicine too quickly may cause serious side effects or your condition may worsen. A special MedGuide will be given to you by the pharmacist with each prescription and refill. Be sure to read this information carefully each time. Talk to your pediatrician regarding the use of this medicine in children. Special care may be needed. Overdosage: If you think you have taken too much of this medicine contact a poison control center or emergency room at once. NOTE: This medicine is only for you. Do not share this medicine with others. What if I miss a dose? If you miss a dose, take it as soon as you can. If it is almost time for your next dose, take only that dose. Do not take double or extra doses. What may interact with this medicine? Do not take this medicine with any of the following medications:  cisapride  dronedarone  linezolid  MAOIs like Carbex, Eldepryl, Marplan, Nardil, and Parnate  methylene blue (injected into a vein)  pimozide  thioridazine This medicine may also interact with the following medications:  alcohol  antihistamines for allergy, cough, and cold  atropine  certain medicines for bladder problems like oxybutynin, tolterodine  certain medicines for depression like amitriptyline, fluoxetine, sertraline  certain medicines for Parkinson's disease like benztropine, trihexyphenidyl  certain medicines for stomach problems like dicyclomine, hyoscyamine  certain medicines for travel sickness like scopolamine  chlorpropamide  cimetidine  ipratropium  other medicines that prolong the QT interval (an abnormal heart rhythm) like dofetilide  other medicines that can cause serotonin syndrome like St. John's Wort, fentanyl, lithium, tramadol, tryptophan, buspirone, and some medicines for headaches like sumatriptan or  rizatriptan  quinidine  reserpine  thyroid medicine This list may not describe all possible interactions. Give your health care provider a list of all the medicines, herbs, non-prescription drugs, or dietary supplements you use. Also tell them if you smoke, drink alcohol, or use illegal drugs. Some items may interact with your medicine. What should I watch for while using this medicine? Tell your doctor if your symptoms do not get better or if they get worse. Visit your doctor or health care professional for regular checks on your progress. Because it may take several weeks to see the full effects of this medicine, it is important to continue your treatment as prescribed by your doctor. Patients and their families should watch out for new or worsening thoughts of suicide or depression. Also watch out for sudden changes in feelings such as feeling anxious, agitated, panicky, irritable, hostile, aggressive, impulsive, severely restless, overly excited and hyperactive, or not being able to sleep. If this happens, especially at the beginning of treatment or after a change in dose, call your health care professional. Bonita Quin may get drowsy or dizzy. Do not drive, use machinery, or do anything that needs mental alertness until you know how this medicine affects you. Do not stand or sit up quickly, especially if you are an older patient. This reduces the risk of dizzy or fainting spells. Alcohol may interfere with the effect of this medicine. Avoid alcoholic drinks. Do not treat yourself for coughs, colds, or allergies without asking your doctor or health care professional for advice. Some ingredients can increase possible side effects. Your mouth may get dry. Chewing sugarless gum or sucking hard candy, and drinking plenty of water may help. Contact your doctor if the problem does not go away or is severe. This  medicine may cause dry eyes and blurred vision. If you wear contact lenses you may feel some  discomfort. Lubricating drops may help. See your eye doctor if the problem does not go away or is severe. This medicine can cause constipation. Try to have a bowel movement at least every 2 to 3 days. If you do not have a bowel movement for 3 days, call your doctor or health care professional. This medicine can make you more sensitive to the sun. Keep out of the sun. If you cannot avoid being in the sun, wear protective clothing and use sunscreen. Do not use sun lamps or tanning beds/booths. What side effects may I notice from receiving this medicine? Side effects that you should report to your doctor or health care professional as soon as possible:  allergic reactions like skin rash, itching or hives, swelling of the face, lips, or tongue  anxious  breathing problems  changes in vision  confusion  elevated mood, decreased need for sleep, racing thoughts, impulsive behavior  eye pain  fast, irregular heartbeat  feeling faint or lightheaded, falls  feeling agitated, angry, or irritable  fever with increased sweating  hallucination, loss of contact with reality  seizures  stiff muscles  suicidal thoughts or other mood changes  tingling, pain, or numbness in the feet or hands  trouble passing urine or change in the amount of urine  trouble sleeping  unusually weak or tired  vomiting  yellowing of the eyes or skin Side effects that usually do not require medical attention (report to your doctor or health care professional if they continue or are bothersome):  change in sex drive or performance  change in appetite or weight  constipation  dizziness  dry mouth  nausea  tired  tremors  upset stomach This list may not describe all possible side effects. Call your doctor for medical advice about side effects. You may report side effects to FDA at 1-800-FDA-1088. Where should I keep my medicine? Keep out of the reach of children. Store at room temperature  between 15 and 30 degrees C (59 and 86 degrees F). Keep container tightly closed. Throw away any unused medicine after the expiration date. NOTE: This sheet is a summary. It may not cover all possible information. If you have questions about this medicine, talk to your doctor, pharmacist, or health care provider.  2020 Elsevier/Gold Standard (2018-08-28 13:24:58)   Duloxetine delayed-release capsules What is this medicine? DULOXETINE (doo LOX e teen) is used to treat depression, anxiety, and different types of chronic pain. This medicine may be used for other purposes; ask your health care provider or pharmacist if you have questions. COMMON BRAND NAME(S): Cymbalta, Murrell Converse, Irenka What should I tell my health care provider before I take this medicine? They need to know if you have any of these conditions:  bipolar disorder  glaucoma  high blood pressure  kidney disease  liver disease  seizures  suicidal thoughts, plans or attempt; a previous suicide attempt by you or a family member  take medicines that treat or prevent blood clots  taken medicines called MAOIs like Carbex, Eldepryl, Marplan, Nardil, and Parnate within 14 days  trouble passing urine  an unusual reaction to duloxetine, other medicines, foods, dyes, or preservatives  pregnant or trying to get pregnant  breast-feeding How should I use this medicine? Take this medicine by mouth with a glass of water. Follow the directions on the prescription label. Do not crush, cut or chew some capsules  of this medicine. Some capsules may be opened and sprinkled on applesauce. Check with your doctor or pharmacist if you are not sure. You can take this medicine with or without food. Take your medicine at regular intervals. Do not take your medicine more often than directed. Do not stop taking this medicine suddenly except upon the advice of your doctor. Stopping this medicine too quickly may cause serious side effects or your  condition may worsen. A special MedGuide will be given to you by the pharmacist with each prescription and refill. Be sure to read this information carefully each time. Talk to your pediatrician regarding the use of this medicine in children. While this drug may be prescribed for children as young as 2 years of age for selected conditions, precautions do apply. Overdosage: If you think you have taken too much of this medicine contact a poison control center or emergency room at once. NOTE: This medicine is only for you. Do not share this medicine with others. What if I miss a dose? If you miss a dose, take it as soon as you can. If it is almost time for your next dose, take only that dose. Do not take double or extra doses. What may interact with this medicine? Do not take this medicine with any of the following medications:  desvenlafaxine  levomilnacipran  linezolid  MAOIs like Carbex, Eldepryl, Marplan, Nardil, and Parnate  methylene blue (injected into a vein)  milnacipran  thioridazine  venlafaxine This medicine may also interact with the following medications:  alcohol  amphetamines  aspirin and aspirin-like medicines  certain antibiotics like ciprofloxacin and enoxacin  certain medicines for blood pressure, heart disease, irregular heart beat  certain medicines for depression, anxiety, or psychotic disturbances  certain medicines for migraine headache like almotriptan, eletriptan, frovatriptan, naratriptan, rizatriptan, sumatriptan, zolmitriptan  certain medicines that treat or prevent blood clots like warfarin, enoxaparin, and dalteparin  cimetidine  fentanyl  lithium  NSAIDS, medicines for pain and inflammation, like ibuprofen or naproxen  phentermine  procarbazine  rasagiline  sibutramine  St. John's wort  theophylline  tramadol  tryptophan This list may not describe all possible interactions. Give your health care provider a list of all the  medicines, herbs, non-prescription drugs, or dietary supplements you use. Also tell them if you smoke, drink alcohol, or use illegal drugs. Some items may interact with your medicine. What should I watch for while using this medicine? Tell your doctor if your symptoms do not get better or if they get worse. Visit your doctor or healthcare provider for regular checks on your progress. Because it may take several weeks to see the full effects of this medicine, it is important to continue your treatment as prescribed by your doctor. This medicine may cause serious skin reactions. They can happen weeks to months after starting the medicine. Contact your healthcare provider right away if you notice fevers or flu-like symptoms with a rash. The rash may be red or purple and then turn into blisters or peeling of the skin. Or, you might notice a red rash with swelling of the face, lips, or lymph nodes in your neck or under your arms. Patients and their families should watch out for new or worsening thoughts of suicide or depression. Also watch out for sudden changes in feelings such as feeling anxious, agitated, panicky, irritable, hostile, aggressive, impulsive, severely restless, overly excited and hyperactive, or not being able to sleep. If this happens, especially at the beginning of treatment or  after a change in dose, call your healthcare provider. You may get drowsy or dizzy. Do not drive, use machinery, or do anything that needs mental alertness until you know how this medicine affects you. Do not stand or sit up quickly, especially if you are an older patient. This reduces the risk of dizzy or fainting spells. Alcohol may interfere with the effect of this medicine. Avoid alcoholic drinks. This medicine can cause an increase in blood pressure. This medicine can also cause a sudden drop in your blood pressure, which may make you feel faint and increase the chance of a fall. These effects are most common when you  first start the medicine or when the dose is increased, or during use of other medicines that can cause a sudden drop in blood pressure. Check with your doctor for instructions on monitoring your blood pressure while taking this medicine. Your mouth may get dry. Chewing sugarless gum or sucking hard candy, and drinking plenty of water, may help. Contact your doctor if the problem does not go away or is severe. What side effects may I notice from receiving this medicine? Side effects that you should report to your doctor or health care professional as soon as possible:  allergic reactions like skin rash, itching or hives, swelling of the face, lips, or tongue  anxious  breathing problems  confusion  changes in vision  chest pain  confusion  elevated mood, decreased need for sleep, racing thoughts, impulsive behavior  eye pain  fast, irregular heartbeat  feeling faint or lightheaded, falls  feeling agitated, angry, or irritable  hallucination, loss of contact with reality  high blood pressure  loss of balance or coordination  palpitations  redness, blistering, peeling or loosening of the skin, including inside the mouth  restlessness, pacing, inability to keep still  seizures  stiff muscles  suicidal thoughts or other mood changes  trouble passing urine or change in the amount of urine  trouble sleeping  unusual bleeding or bruising  unusually weak or tired  vomiting  yellowing of the eyes or skin Side effects that usually do not require medical attention (report to your doctor or health care professional if they continue or are bothersome):  change in sex drive or performance  change in appetite or weight  constipation  dizziness  dry mouth  headache  increased sweating  nausea  tired This list may not describe all possible side effects. Call your doctor for medical advice about side effects. You may report side effects to FDA at  1-800-FDA-1088. Where should I keep my medicine? Keep out of the reach of children. Store at room temperature between 15 and 30 degrees C (59 to 86 degrees F). Throw away any unused medicine after the expiration date. NOTE: This sheet is a summary. It may not cover all possible information. If you have questions about this medicine, talk to your doctor, pharmacist, or health care provider.  2020 Elsevier/Gold Standard (2018-12-06 13:47:50)  Palpitations Palpitations are feelings that your heartbeat is irregular or is faster than normal. It may feel like your heart is fluttering or skipping a beat. Palpitations are usually not a serious problem. They may be caused by many things, including smoking, caffeine, alcohol, stress, and certain medicines or drugs. Most causes of palpitations are not serious. However, some palpitations can be a sign of a serious problem. You may need further tests to rule out serious medical problems. Follow these instructions at home:     Pay attention  to any changes in your condition. Take these actions to help manage your symptoms: Eating and drinking  Avoid foods and drinks that may cause palpitations. These may include: ? Caffeinated coffee, tea, soft drinks, diet pills, and energy drinks. ? Chocolate. ? Alcohol. Lifestyle  Take steps to reduce your stress and anxiety. Things that can help you relax include: ? Yoga. ? Mind-body activities, such as deep breathing, meditation, or using words and images to create positive thoughts (guided imagery). ? Physical activity, such as swimming, jogging, or walking. Tell your health care provider if your palpitations increase with activity. If you have chest pain or shortness of breath with activity, do not continue the activity until you are seen by your health care provider. ? Biofeedback. This is a method that helps you learn to use your mind to control things in your body, such as your heartbeat.  Do not use drugs,  including cocaine or ecstasy. Do not use marijuana.  Get plenty of rest and sleep. Keep a regular bed time. General instructions  Take over-the-counter and prescription medicines only as told by your health care provider.  Do not use any products that contain nicotine or tobacco, such as cigarettes and e-cigarettes. If you need help quitting, ask your health care provider.  Keep all follow-up visits as told by your health care provider. This is important. These may include visits for further testing if palpitations do not go away or get worse. Contact a health care provider if you:  Continue to have a fast or irregular heartbeat after 24 hours.  Notice that your palpitations occur more often. Get help right away if you:  Have chest pain or shortness of breath.  Have a severe headache.  Feel dizzy or you faint. Summary  Palpitations are feelings that your heartbeat is irregular or is faster than normal. It may feel like your heart is fluttering or skipping a beat.  Palpitations may be caused by many things, including smoking, caffeine, alcohol, stress, certain medicines, and drugs.  Although most causes of palpitations are not serious, some causes can be a sign of a serious medical problem.  Get help right away if you faint or have chest pain, shortness of breath, a severe headache, or dizziness. This information is not intended to replace advice given to you by your health care provider. Make sure you discuss any questions you have with your health care provider. Document Revised: 10/18/2017 Document Reviewed: 10/18/2017 Elsevier Patient Education  2020 [employer].
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
## 2020-07-31 ENCOUNTER — Telehealth: Payer: Self-pay | Admitting: Internal Medicine

## 2020-07-31 NOTE — Telephone Encounter (Signed)
lft vm for pt to call ofc 

## 2020-08-03 ENCOUNTER — Encounter: Payer: Self-pay | Admitting: Internal Medicine

## 2020-08-03 ENCOUNTER — Telehealth: Payer: Self-pay | Admitting: Internal Medicine

## 2020-08-03 DIAGNOSIS — R519 Headache, unspecified: Secondary | ICD-10-CM | POA: Insufficient documentation

## 2020-08-03 DIAGNOSIS — G894 Chronic pain syndrome: Secondary | ICD-10-CM | POA: Insufficient documentation

## 2020-08-03 DIAGNOSIS — R5383 Other fatigue: Secondary | ICD-10-CM | POA: Insufficient documentation

## 2020-08-03 DIAGNOSIS — T781XXA Other adverse food reactions, not elsewhere classified, initial encounter: Secondary | ICD-10-CM | POA: Insufficient documentation

## 2020-08-03 DIAGNOSIS — F909 Attention-deficit hyperactivity disorder, unspecified type: Secondary | ICD-10-CM | POA: Insufficient documentation

## 2020-08-03 DIAGNOSIS — G8929 Other chronic pain: Secondary | ICD-10-CM | POA: Insufficient documentation

## 2020-08-03 DIAGNOSIS — R591 Generalized enlarged lymph nodes: Secondary | ICD-10-CM | POA: Insufficient documentation

## 2020-08-03 NOTE — Telephone Encounter (Signed)
lft vm for pt to call ofc 

## 2020-08-04 NOTE — Telephone Encounter (Signed)
lft vm for pt to call ofc 

## 2020-08-05 ENCOUNTER — Telehealth: Payer: Self-pay | Admitting: Internal Medicine

## 2020-08-05 NOTE — Telephone Encounter (Signed)
Rejection Reason - Other - per Dr Jaclyn Shaggy arent rheumatology related. labs are normal" [facility] said on Aug 05, 2020 3:01 PM

## 2020-08-05 NOTE — Telephone Encounter (Signed)
These labs are rheumatologic related labs  Can we try Dr. Loni Beckwith rheumatology?   Thanks

## 2020-08-06 NOTE — Telephone Encounter (Signed)
OK! Referra; sent. Thank you!

## 2020-08-06 NOTE — Telephone Encounter (Signed)
Good morning!  Sure is that provider in the same office?

## 2020-08-06 NOTE — Telephone Encounter (Signed)
No but in [place]

## 2020-08-07 ENCOUNTER — Ambulatory Visit
Admission: RE | Admit: 2020-08-07 | Discharge: 2020-08-07 | Disposition: A | Discharge status: Home / Self Care | Payer: Self-pay | Source: Ambulatory Visit | Attending: Internal Medicine | Admitting: Internal Medicine

## 2020-08-07 DIAGNOSIS — R591 Generalized enlarged lymph nodes: Secondary | ICD-10-CM

## 2020-08-08 ENCOUNTER — Encounter: Payer: Self-pay | Admitting: *Deleted

## 2020-09-03 ENCOUNTER — Other Ambulatory Visit: Payer: Self-pay | Admitting: Internal Medicine

## 2020-09-03 DIAGNOSIS — M791 Myalgia, unspecified site: Secondary | ICD-10-CM

## 2020-09-03 DIAGNOSIS — M542 Cervicalgia: Secondary | ICD-10-CM

## 2020-09-03 DIAGNOSIS — M7918 Myalgia, other site: Secondary | ICD-10-CM

## 2020-09-03 MED ORDER — TIZANIDINE HCL 2 MG PO TABS: 2.0000 mg | ORAL_TABLET | Freq: Every evening | ORAL | 0 refills | Status: DC | PRN

## 2020-09-03 MED ORDER — NORTRIPTYLINE HCL 10 MG PO CAPS: 10.0000 mg | ORAL_CAPSULE | Freq: Every day | ORAL | 0 refills | Status: DC

## 2020-12-07 ENCOUNTER — Other Ambulatory Visit: Payer: Self-pay | Admitting: Internal Medicine

## 2020-12-07 DIAGNOSIS — M542 Cervicalgia: Secondary | ICD-10-CM

## 2020-12-07 DIAGNOSIS — M7918 Myalgia, other site: Secondary | ICD-10-CM

## 2020-12-07 DIAGNOSIS — M791 Myalgia, unspecified site: Secondary | ICD-10-CM

## 2020-12-07 MED ORDER — TIZANIDINE HCL 2 MG PO TABS: 2.0000 mg | ORAL_TABLET | Freq: Every evening | ORAL | 0 refills | Status: DC | PRN

## 2020-12-07 MED ORDER — NORTRIPTYLINE HCL 10 MG PO CAPS: 10.0000 mg | ORAL_CAPSULE | Freq: Every day | ORAL | 0 refills | Status: DC

## 2021-02-24 ENCOUNTER — Other Ambulatory Visit: Payer: Self-pay | Admitting: Internal Medicine

## 2021-02-24 ENCOUNTER — Telehealth: Payer: Self-pay | Admitting: Internal Medicine

## 2021-02-24 DIAGNOSIS — M542 Cervicalgia: Secondary | ICD-10-CM

## 2021-02-24 DIAGNOSIS — M791 Myalgia, unspecified site: Secondary | ICD-10-CM

## 2021-02-24 DIAGNOSIS — M7918 Myalgia, other site: Secondary | ICD-10-CM

## 2021-02-24 MED ORDER — TIZANIDINE HCL 2 MG PO TABS: 2.0000 mg | ORAL_TABLET | Freq: Every evening | ORAL | 2 refills | Status: AC | PRN

## 2021-02-24 MED ORDER — NORTRIPTYLINE HCL 10 MG PO CAPS: 10.0000 mg | ORAL_CAPSULE | Freq: Every day | ORAL | 2 refills | Status: AC

## 2021-02-24 NOTE — Telephone Encounter (Signed)
Please sch in person fu by 07/31/21 or after thanks

## 2021-03-01 NOTE — Telephone Encounter (Signed)
Lvm to return on 07/31/21 or after
